# Patient Record
Sex: Male | Born: 1985 | Race: White | Hispanic: No | Smoking: Never smoker
Health system: Southern US, Community
[De-identification: ages and names within clinical notes are randomized; demographics above are authoritative.]

## PROBLEM LIST (undated history)

## (undated) HISTORY — PX: KNEE SURGERY: SHX244

---

## 2010-09-06 ENCOUNTER — Emergency Department (HOSPITAL_COMMUNITY): Admission: EM | Admit: 2010-09-06 | Discharge: 2010-09-06 | Payer: Self-pay | Admitting: Emergency Medicine

## 2011-12-31 ENCOUNTER — Encounter (HOSPITAL_COMMUNITY): Payer: Self-pay | Admitting: *Deleted

## 2011-12-31 ENCOUNTER — Emergency Department (HOSPITAL_COMMUNITY): Payer: Worker's Compensation

## 2011-12-31 ENCOUNTER — Emergency Department (HOSPITAL_COMMUNITY)
Admission: EM | Admit: 2011-12-31 | Discharge: 2011-12-31 | Disposition: A | Discharge status: Home / Self Care | Payer: Worker's Compensation | Attending: Emergency Medicine | Admitting: Emergency Medicine

## 2011-12-31 DIAGNOSIS — S63502A Unspecified sprain of left wrist, initial encounter: Secondary | ICD-10-CM

## 2011-12-31 DIAGNOSIS — IMO0002 Reserved for concepts with insufficient information to code with codable children: Secondary | ICD-10-CM | POA: Insufficient documentation

## 2011-12-31 DIAGNOSIS — M7989 Other specified soft tissue disorders: Secondary | ICD-10-CM | POA: Insufficient documentation

## 2011-12-31 DIAGNOSIS — S63602A Unspecified sprain of left thumb, initial encounter: Secondary | ICD-10-CM

## 2011-12-31 DIAGNOSIS — M25539 Pain in unspecified wrist: Secondary | ICD-10-CM | POA: Insufficient documentation

## 2011-12-31 DIAGNOSIS — M25439 Effusion, unspecified wrist: Secondary | ICD-10-CM | POA: Insufficient documentation

## 2011-12-31 DIAGNOSIS — S63509A Unspecified sprain of unspecified wrist, initial encounter: Secondary | ICD-10-CM | POA: Insufficient documentation

## 2011-12-31 DIAGNOSIS — S6390XA Sprain of unspecified part of unspecified wrist and hand, initial encounter: Secondary | ICD-10-CM | POA: Insufficient documentation

## 2011-12-31 NOTE — ED Notes (Addendum)
Pt alert & oriented x4, stable gait. Pt given discharge instructions, paperwork. Patient instructed to stop at the registration desk to finish any additional paperwork. pt verbalized understanding. Pt left department w/ no further questions.  
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
## 2011-12-31 NOTE — ED Notes (Signed)
Ice applied to left wrist

## 2011-12-31 NOTE — ED Notes (Signed)
Pt has sensation in hand & fingers. Pain when moving thumb. Had pt remove ring as a precaution against swelling

## 2011-12-31 NOTE — ED Notes (Signed)
Patient transported to X-ray 
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
## 2011-12-31 NOTE — Discharge Instructions (Signed)
Sprains Sprains are painful injuries to joints as a result of partial or complete tearing of ligaments. HOME CARE INSTRUCTIONS   For the first 24 hours, keep the injured limb raised on 2 pillows while lying down.   Apply ice bags about every 2 hours for 20 to 30 minutes, while awake, to the injured area for the first 24 hours. Then apply as directed by your caregiver. Place the ice in a plastic bag with a towel around it to prevent frostbite to the skin.   Only take over-the-counter or prescription medicines for pain, discomfort, or fever as directed by your caregiver.   If an ace bandage (a stretchy, elastic wrapping bandage) has been applied today, remove and reapply every 3 to 4 hours. Apply firm enough to keep swelling down. Donot apply tightly. Watch fingers or toes for swelling, bluish discoloration, coldness, numbness, or excessive pain. If any of these problems (symptoms) occur, remove the ace bandage and reapply it more loosely. Contact your caregiver or return to this location if these symptoms persist.  Persistent pain and inability to use the injured area for more than 2 to 3 days are warning signs. See a caregiver for a follow-up visit as soon as possible. A hairline fracture (broken bone) may not show on X-rays. Persistent pain and swelling indicate that further evaluation, use of crutches, and/or more X-rays are needed. X-rays may sometimes not show a small fracture until a week or ten days later. Make a follow-up appointment with your own caregiver or to whom we have referred you. A specialist in reading X-rays(radiologist) will re-read your X-rays. Make sure you know how to obtain your X-ray results. Do not assume everything is normal if you do not hear from us. SEEK IMMEDIATE MEDICAL CARE IF:  You develop severe pain or more swelling.   The pain is not controlled with medicine.   Your skin or nails below the injury turn blue or grey or feel cold or numb.  Document Released:  10/09/2000 Document Revised: 10/01/2011 Document Reviewed: 05/28/2008 [employer] Patient Information 2012 [employer]. 

## 2017-02-04 DIAGNOSIS — Z0000 Encounter for general adult medical examination without abnormal findings: Secondary | ICD-10-CM | POA: Diagnosis not present

## 2017-02-04 DIAGNOSIS — Z6834 Body mass index (BMI) 34.0-34.9, adult: Secondary | ICD-10-CM | POA: Diagnosis not present

## 2018-03-04 DIAGNOSIS — D142 Benign neoplasm of trachea: Secondary | ICD-10-CM | POA: Diagnosis not present

## 2018-03-04 DIAGNOSIS — Z6833 Body mass index (BMI) 33.0-33.9, adult: Secondary | ICD-10-CM | POA: Diagnosis not present

## 2018-03-07 ENCOUNTER — Other Ambulatory Visit (HOSPITAL_COMMUNITY): Payer: Self-pay | Admitting: Internal Medicine

## 2018-03-07 DIAGNOSIS — R221 Localized swelling, mass and lump, neck: Secondary | ICD-10-CM

## 2018-03-09 ENCOUNTER — Ambulatory Visit (HOSPITAL_COMMUNITY)
Admission: RE | Admit: 2018-03-09 | Discharge: 2018-03-09 | Disposition: A | Discharge status: Home / Self Care | Payer: BLUE CROSS/BLUE SHIELD | Source: Ambulatory Visit | Attending: Internal Medicine | Admitting: Internal Medicine

## 2018-03-09 DIAGNOSIS — R221 Localized swelling, mass and lump, neck: Secondary | ICD-10-CM | POA: Insufficient documentation

## 2018-03-11 ENCOUNTER — Other Ambulatory Visit (HOSPITAL_COMMUNITY): Payer: Self-pay | Admitting: Internal Medicine

## 2018-03-11 DIAGNOSIS — R221 Localized swelling, mass and lump, neck: Secondary | ICD-10-CM

## 2018-03-11 DIAGNOSIS — R9389 Abnormal findings on diagnostic imaging of other specified body structures: Secondary | ICD-10-CM

## 2018-03-14 ENCOUNTER — Ambulatory Visit (HOSPITAL_COMMUNITY)
Admission: RE | Admit: 2018-03-14 | Discharge: 2018-03-14 | Disposition: A | Discharge status: Home / Self Care | Payer: BLUE CROSS/BLUE SHIELD | Source: Ambulatory Visit | Attending: Internal Medicine | Admitting: Internal Medicine

## 2018-03-14 DIAGNOSIS — R9389 Abnormal findings on diagnostic imaging of other specified body structures: Secondary | ICD-10-CM | POA: Diagnosis not present

## 2018-03-14 DIAGNOSIS — R221 Localized swelling, mass and lump, neck: Secondary | ICD-10-CM | POA: Diagnosis not present

## 2018-03-14 DIAGNOSIS — E041 Nontoxic single thyroid nodule: Secondary | ICD-10-CM | POA: Diagnosis not present

## 2018-03-14 MED ORDER — IOPAMIDOL (ISOVUE-300) INJECTION 61%
100.0000 mL | Freq: Once | INTRAVENOUS | Status: AC | PRN
  Administered 2018-03-14: 100 mL via INTRAVENOUS

## 2018-03-16 ENCOUNTER — Other Ambulatory Visit (HOSPITAL_COMMUNITY): Payer: Self-pay | Admitting: Adult Health Nurse Practitioner

## 2018-03-16 DIAGNOSIS — E041 Nontoxic single thyroid nodule: Secondary | ICD-10-CM

## 2018-03-18 ENCOUNTER — Ambulatory Visit (HOSPITAL_COMMUNITY)
Admission: RE | Admit: 2018-03-18 | Discharge: 2018-03-18 | Disposition: A | Discharge status: Home / Self Care | Payer: BLUE CROSS/BLUE SHIELD | Source: Ambulatory Visit | Attending: Adult Health Nurse Practitioner | Admitting: Adult Health Nurse Practitioner

## 2018-03-18 DIAGNOSIS — E041 Nontoxic single thyroid nodule: Secondary | ICD-10-CM | POA: Diagnosis not present

## 2018-03-28 DIAGNOSIS — E782 Mixed hyperlipidemia: Secondary | ICD-10-CM | POA: Diagnosis not present

## 2018-04-01 DIAGNOSIS — Z6833 Body mass index (BMI) 33.0-33.9, adult: Secondary | ICD-10-CM | POA: Diagnosis not present

## 2018-04-01 DIAGNOSIS — D142 Benign neoplasm of trachea: Secondary | ICD-10-CM | POA: Diagnosis not present

## 2018-05-26 DIAGNOSIS — M25551 Pain in right hip: Secondary | ICD-10-CM | POA: Diagnosis not present

## 2018-05-26 DIAGNOSIS — R5383 Other fatigue: Secondary | ICD-10-CM | POA: Diagnosis not present

## 2018-05-26 DIAGNOSIS — Z13220 Encounter for screening for lipoid disorders: Secondary | ICD-10-CM | POA: Diagnosis not present

## 2018-05-26 DIAGNOSIS — Z131 Encounter for screening for diabetes mellitus: Secondary | ICD-10-CM | POA: Diagnosis not present

## 2018-05-26 DIAGNOSIS — E669 Obesity, unspecified: Secondary | ICD-10-CM | POA: Diagnosis not present

## 2018-05-26 DIAGNOSIS — E559 Vitamin D deficiency, unspecified: Secondary | ICD-10-CM | POA: Diagnosis not present

## 2018-06-06 DIAGNOSIS — S233XXA Sprain of ligaments of thoracic spine, initial encounter: Secondary | ICD-10-CM | POA: Diagnosis not present

## 2018-06-06 DIAGNOSIS — S134XXA Sprain of ligaments of cervical spine, initial encounter: Secondary | ICD-10-CM | POA: Diagnosis not present

## 2018-06-06 DIAGNOSIS — S338XXA Sprain of other parts of lumbar spine and pelvis, initial encounter: Secondary | ICD-10-CM | POA: Diagnosis not present

## 2018-06-07 DIAGNOSIS — S338XXA Sprain of other parts of lumbar spine and pelvis, initial encounter: Secondary | ICD-10-CM | POA: Diagnosis not present

## 2018-06-07 DIAGNOSIS — S233XXA Sprain of ligaments of thoracic spine, initial encounter: Secondary | ICD-10-CM | POA: Diagnosis not present

## 2018-06-07 DIAGNOSIS — S134XXA Sprain of ligaments of cervical spine, initial encounter: Secondary | ICD-10-CM | POA: Diagnosis not present

## 2018-06-13 DIAGNOSIS — S134XXA Sprain of ligaments of cervical spine, initial encounter: Secondary | ICD-10-CM | POA: Diagnosis not present

## 2018-06-13 DIAGNOSIS — S233XXA Sprain of ligaments of thoracic spine, initial encounter: Secondary | ICD-10-CM | POA: Diagnosis not present

## 2018-06-13 DIAGNOSIS — S338XXA Sprain of other parts of lumbar spine and pelvis, initial encounter: Secondary | ICD-10-CM | POA: Diagnosis not present

## 2018-06-21 DIAGNOSIS — S134XXA Sprain of ligaments of cervical spine, initial encounter: Secondary | ICD-10-CM | POA: Diagnosis not present

## 2018-06-21 DIAGNOSIS — S338XXA Sprain of other parts of lumbar spine and pelvis, initial encounter: Secondary | ICD-10-CM | POA: Diagnosis not present

## 2018-06-21 DIAGNOSIS — S233XXA Sprain of ligaments of thoracic spine, initial encounter: Secondary | ICD-10-CM | POA: Diagnosis not present

## 2018-06-29 DIAGNOSIS — S338XXA Sprain of other parts of lumbar spine and pelvis, initial encounter: Secondary | ICD-10-CM | POA: Diagnosis not present

## 2018-06-29 DIAGNOSIS — S134XXA Sprain of ligaments of cervical spine, initial encounter: Secondary | ICD-10-CM | POA: Diagnosis not present

## 2018-06-29 DIAGNOSIS — S233XXA Sprain of ligaments of thoracic spine, initial encounter: Secondary | ICD-10-CM | POA: Diagnosis not present

## 2018-07-27 DIAGNOSIS — E669 Obesity, unspecified: Secondary | ICD-10-CM | POA: Diagnosis not present

## 2018-07-27 DIAGNOSIS — M25551 Pain in right hip: Secondary | ICD-10-CM | POA: Diagnosis not present

## 2018-07-27 DIAGNOSIS — E559 Vitamin D deficiency, unspecified: Secondary | ICD-10-CM | POA: Diagnosis not present

## 2018-10-24 DIAGNOSIS — R7302 Impaired glucose tolerance (oral): Secondary | ICD-10-CM | POA: Diagnosis not present

## 2018-10-24 DIAGNOSIS — E669 Obesity, unspecified: Secondary | ICD-10-CM | POA: Diagnosis not present

## 2018-10-24 DIAGNOSIS — E559 Vitamin D deficiency, unspecified: Secondary | ICD-10-CM | POA: Diagnosis not present

## 2018-12-29 ENCOUNTER — Encounter (INDEPENDENT_AMBULATORY_CARE_PROVIDER_SITE_OTHER): Payer: Self-pay | Admitting: Internal Medicine

## 2019-01-19 ENCOUNTER — Encounter (INDEPENDENT_AMBULATORY_CARE_PROVIDER_SITE_OTHER): Payer: Self-pay | Admitting: Internal Medicine

## 2019-01-26 ENCOUNTER — Ambulatory Visit (INDEPENDENT_AMBULATORY_CARE_PROVIDER_SITE_OTHER): Payer: Self-pay | Admitting: Internal Medicine

## 2019-01-26 DIAGNOSIS — E669 Obesity, unspecified: Secondary | ICD-10-CM | POA: Diagnosis not present

## 2019-01-26 DIAGNOSIS — R7302 Impaired glucose tolerance (oral): Secondary | ICD-10-CM | POA: Diagnosis not present

## 2019-01-26 DIAGNOSIS — E559 Vitamin D deficiency, unspecified: Secondary | ICD-10-CM | POA: Diagnosis not present

## 2020-04-07 IMAGING — US US THYROID
1 series · 13 of 25 positions shown · non-contrast
Comparison: None.

CLINICAL DATA: Incidental on CT. Right thyroid nodule noted on
recent CT.

EXAM:
THYROID ULTRASOUND
TECHNIQUE: Ultrasound examination of the thyroid gland and adjacent soft
tissues was performed.

[Series 1: us thyroid · 0.06mm/px · 13 of 63 slices shown]
[im 1/63]
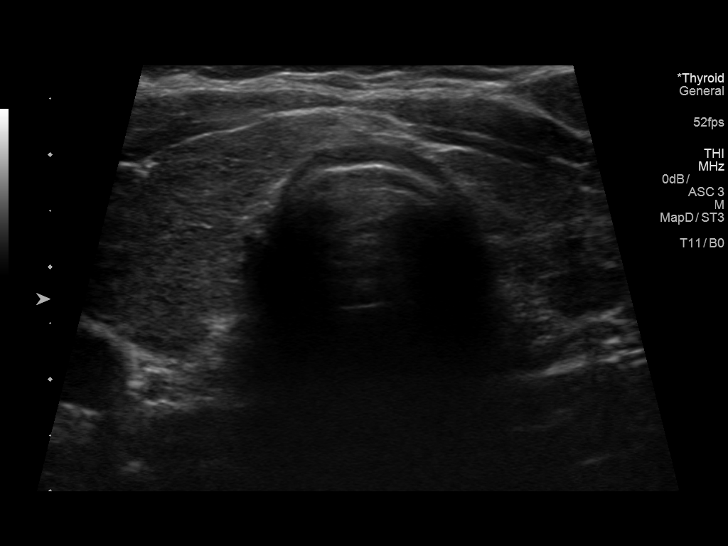
[im 6/63]
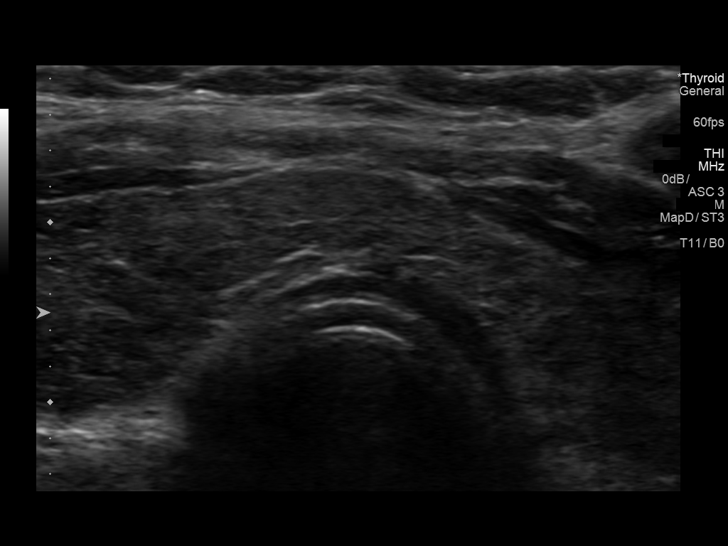
[im 11/63]
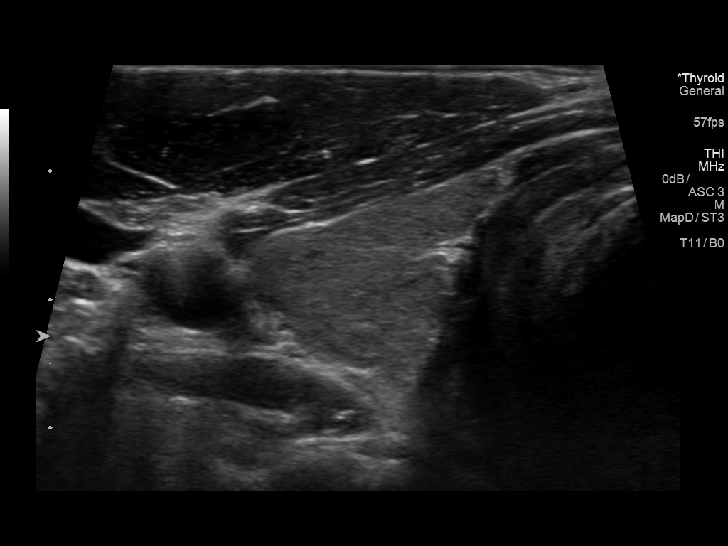
[im 16/63]
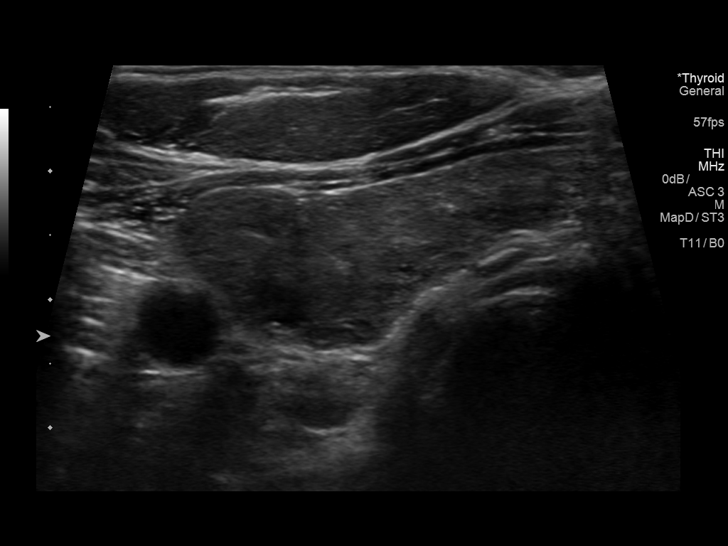
[im 21/63]
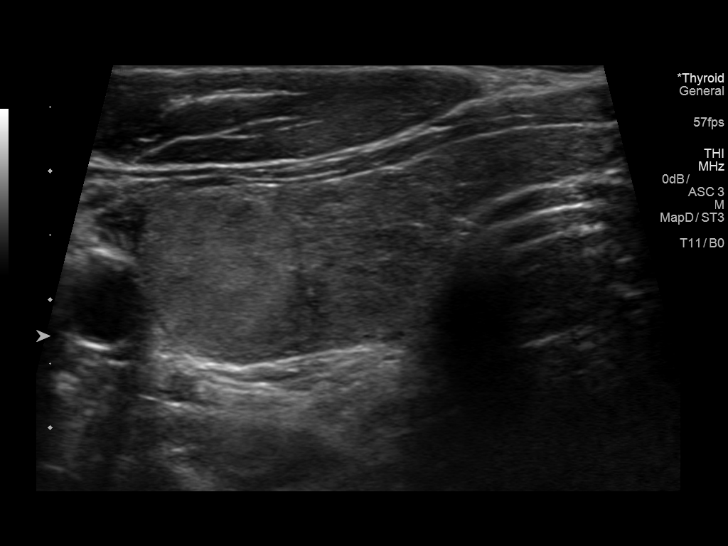
[im 26/63]
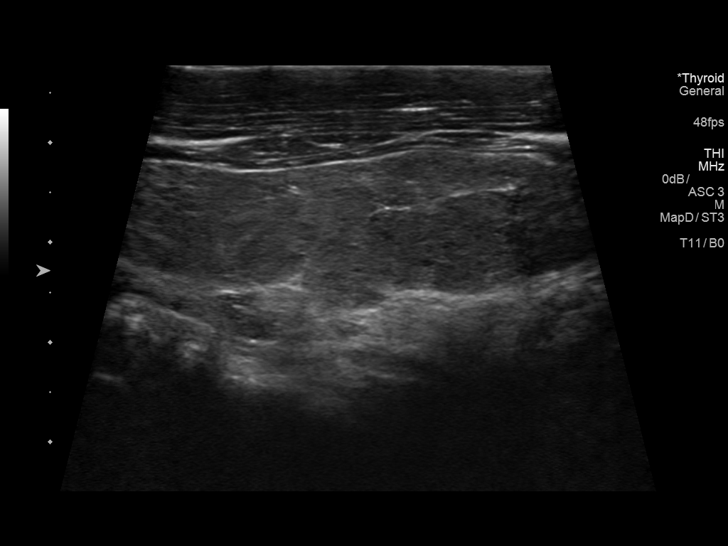
[im 32/63]
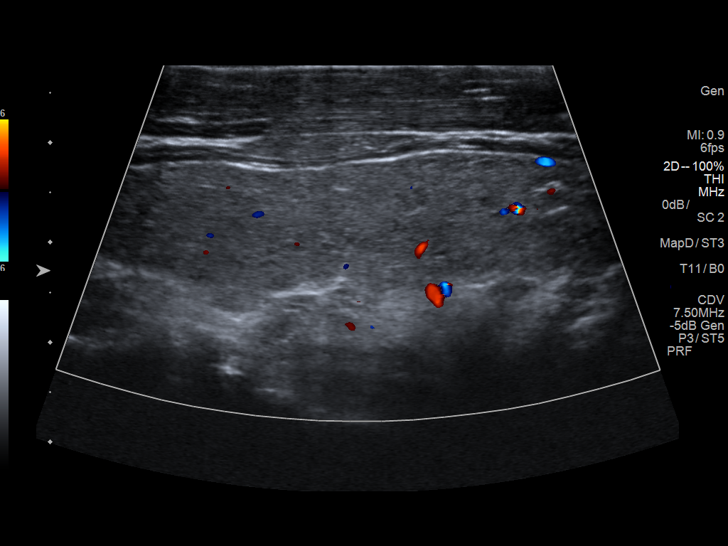
[im 37/63]
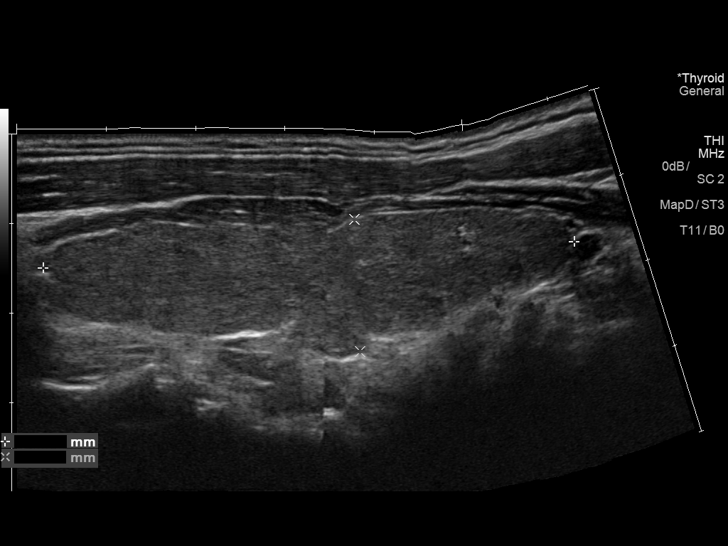
[im 42/63]
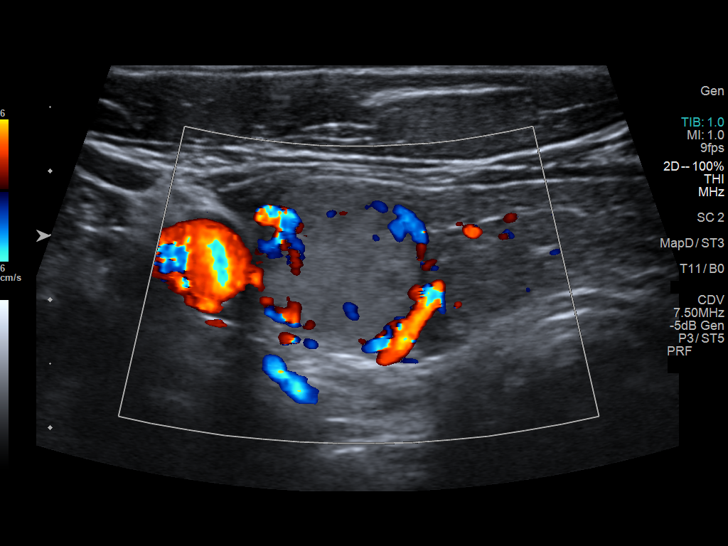
[im 47/63]
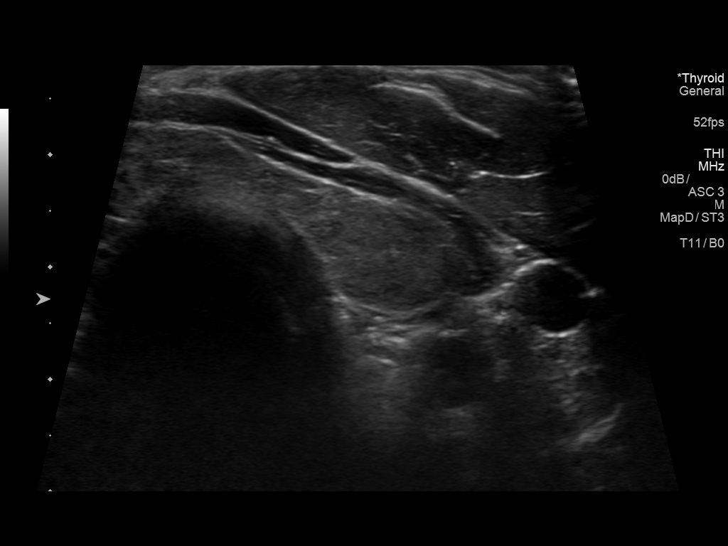
[im 52/63]
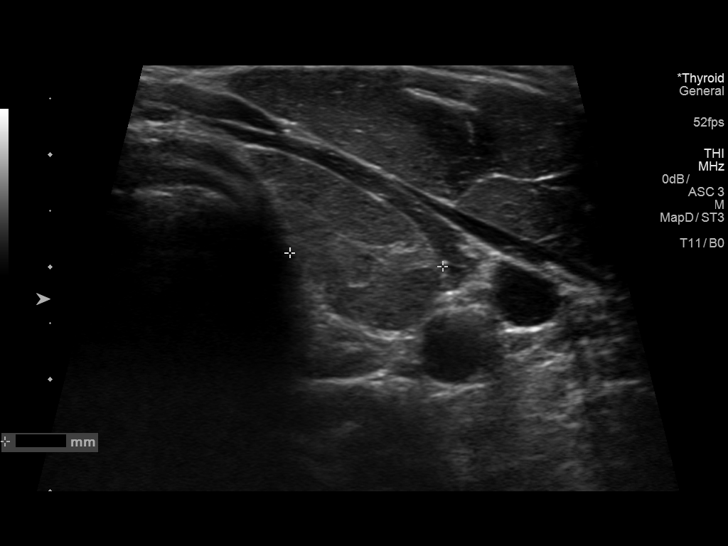
[im 57/63]
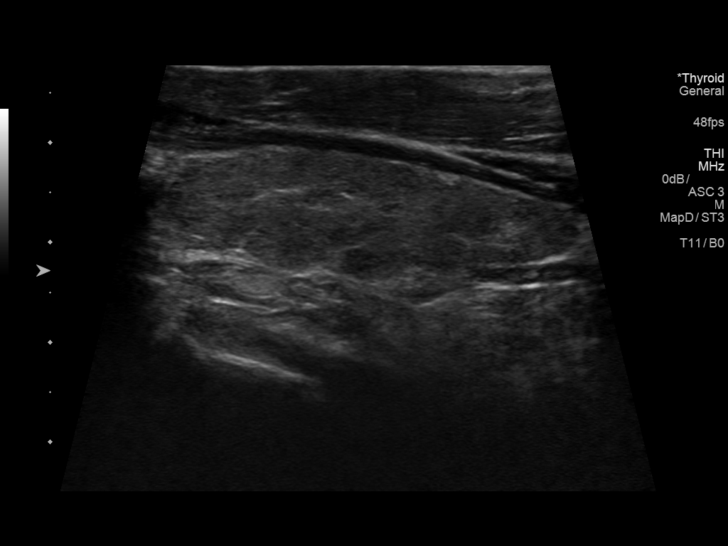
[im 63/63]
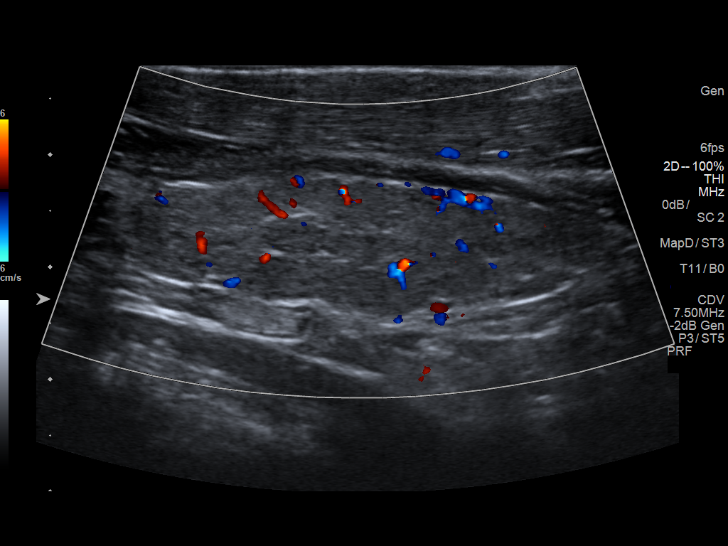

[13 of 25 positions shown; findings below may reference images not displayed]

FINDINGS: Parenchymal Echotexture: Moderately heterogenous

Isthmus: 0.5 cm

Right lobe: 5.9 x 2.3 x 1.5 cm

Left lobe: 4.6 x 1.4 x 1.2 cm

_________________________________________________________

Estimated total number of nodules >/= 1 cm: 1

Number of spongiform nodules >/=  2 cm not described below (TR1): 0

Number of mixed cystic and solid nodules >/= 1.5 cm not described
below (TR2): 0

_________________________________________________________

Nodule # 1:

Location: Right; Mid

Maximum size: 1.5 cm; Other 2 dimensions: 1.3 x 1.2 cm

Composition: solid/almost completely solid (2)

Echogenicity: hyperechoic (1)

Shape: not taller-than-wide (0)

Margins: smooth (0)

Echogenic foci: none (0)

ACR TI-RADS total points: 3.

ACR TI-RADS risk category: TR3 (3 points).

ACR TI-RADS recommendations:

*Given size (>/= 1.5 - 2.4 cm) and appearance, a follow-up
ultrasound in 1 year should be considered based on TI-RADS criteria.

_________________________________________________________
IMPRESSION: Right nodule 1 meets criteria for annual follow-up.

The above is in keeping with the ACR TI-RADS recommendations - [HOSPITAL] 0902;[DATE].

## 2021-07-13 ENCOUNTER — Other Ambulatory Visit: Payer: Self-pay

## 2021-07-13 ENCOUNTER — Emergency Department (HOSPITAL_COMMUNITY): Payer: Commercial Managed Care - PPO

## 2021-07-13 ENCOUNTER — Observation Stay (HOSPITAL_COMMUNITY)
Admission: EM | Admit: 2021-07-13 | Discharge: 2021-07-14 | Disposition: A | Discharge status: Home / Self Care | Payer: Commercial Managed Care - PPO | Attending: Internal Medicine | Admitting: Internal Medicine

## 2021-07-13 ENCOUNTER — Encounter (HOSPITAL_COMMUNITY): Payer: Self-pay | Admitting: Emergency Medicine

## 2021-07-13 DIAGNOSIS — W19XXXA Unspecified fall, initial encounter: Secondary | ICD-10-CM | POA: Diagnosis present

## 2021-07-13 DIAGNOSIS — S3992XA Unspecified injury of lower back, initial encounter: Secondary | ICD-10-CM | POA: Diagnosis present

## 2021-07-13 DIAGNOSIS — T1490XA Injury, unspecified, initial encounter: Secondary | ICD-10-CM

## 2021-07-13 DIAGNOSIS — R42 Dizziness and giddiness: Secondary | ICD-10-CM | POA: Diagnosis not present

## 2021-07-13 DIAGNOSIS — Z20822 Contact with and (suspected) exposure to covid-19: Secondary | ICD-10-CM | POA: Diagnosis not present

## 2021-07-13 DIAGNOSIS — M545 Low back pain, unspecified: Secondary | ICD-10-CM

## 2021-07-13 LAB — COMPREHENSIVE METABOLIC PANEL
ALT: 59 U/L — ABNORMAL HIGH (ref 0–44)
AST: 119 U/L — ABNORMAL HIGH (ref 15–41)
Albumin: 3.9 g/dL (ref 3.5–5.0)
Alkaline Phosphatase: 60 U/L (ref 38–126)
Anion gap: 5 (ref 5–15)
BUN: 20 mg/dL (ref 6–20)
CO2: 25 mmol/L (ref 22–32)
Calcium: 8.4 mg/dL — ABNORMAL LOW (ref 8.9–10.3)
Chloride: 106 mmol/L (ref 98–111)
Creatinine, Ser: 1.23 mg/dL (ref 0.61–1.24)
GFR, Estimated: >60 mL/min (ref >60)
Glucose, Bld: 97 mg/dL (ref 70–99)
Potassium: 4 mmol/L (ref 3.5–5.1)
Sodium: 136 mmol/L (ref 135–145)
Total Bilirubin: 0.4 mg/dL (ref 0.3–1.2)
Total Protein: 6.7 g/dL (ref 6.5–8.1)

## 2021-07-13 LAB — CBC WITH DIFFERENTIAL/PLATELET
Abs Immature Granulocytes: 0.03 10*3/uL (ref 0.00–0.07)
Basophils Absolute: 0.1 10*3/uL (ref 0.0–0.1)
Basophils Relative: 1 %
Eosinophils Absolute: 0.2 10*3/uL (ref 0.0–0.5)
Eosinophils Relative: 2 %
HCT: 41.6 % (ref 39.0–52.0)
Hemoglobin: 13.4 g/dL (ref 13.0–17.0)
Immature Granulocytes: 0 %
Lymphocytes Relative: 23 %
Lymphs Abs: 2.1 10*3/uL (ref 0.7–4.0)
MCH: 27.2 pg (ref 26.0–34.0)
MCHC: 32.2 g/dL (ref 30.0–36.0)
MCV: 84.4 fL (ref 80.0–100.0)
Monocytes Absolute: 0.7 10*3/uL (ref 0.1–1.0)
Monocytes Relative: 8 %
Neutro Abs: 6.1 10*3/uL (ref 1.7–7.7)
Neutrophils Relative %: 66 %
Platelets: 247 10*3/uL (ref 150–400)
RBC: 4.93 MIL/uL (ref 4.22–5.81)
RDW: 13.5 % (ref 11.5–15.5)
WBC: 9.2 10*3/uL (ref 4.0–10.5)
nRBC: 0 % (ref 0.0–0.2)

## 2021-07-13 LAB — PROTIME-INR
INR: 1.1 (ref 0.8–1.2)
Prothrombin Time: 13.8 seconds (ref 11.4–15.2)

## 2021-07-13 MED ORDER — SODIUM CHLORIDE 0.9 % IV BOLUS
500.0000 mL | Freq: Once | INTRAVENOUS | Status: AC
  Administered 2021-07-13: 500 mL via INTRAVENOUS

## 2021-07-13 MED ORDER — DEXAMETHASONE SODIUM PHOSPHATE 10 MG/ML IJ SOLN
10.0000 mg | Freq: Once | INTRAMUSCULAR | Status: AC
  Administered 2021-07-14: 10 mg via INTRAVENOUS
  Filled 2021-07-13: qty 1

## 2021-07-13 MED ORDER — IOHEXOL 350 MG/ML SOLN
80.0000 mL | Freq: Once | INTRAVENOUS | Status: AC | PRN
  Administered 2021-07-13: 80 mL via INTRAVENOUS

## 2021-07-13 MED ORDER — HYDROMORPHONE HCL 1 MG/ML IJ SOLN
1.0000 mg | Freq: Once | INTRAMUSCULAR | Status: AC
  Administered 2021-07-13: 1 mg via INTRAVENOUS
  Filled 2021-07-13: qty 1

## 2021-07-13 MED ORDER — MORPHINE SULFATE (PF) 4 MG/ML IV SOLN
4.0000 mg | Freq: Once | INTRAVENOUS | Status: AC
  Administered 2021-07-13: 4 mg via INTRAVENOUS
  Filled 2021-07-13: qty 1
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
## 2021-07-13 NOTE — ED Provider Notes (Signed)
[facility] EMERGENCY DEPARTMENT Provider Note   CSN: [id number] Arrival date & time: 07/13/21  1956     History Chief Complaint  Patient presents with   Trauma    Thrown from horse    Cody Yu is a 36 y.o. male.  HPI  35 year old male with no admitted past medical history presents emergency department after being thrown from a horse.  Patient reportedly landed flat on his back, denied LOC.  Was not wearing a helmet.  Currently complaining of severe lower back pain.  Does not take any anticoagulation.  Currently denying any chest pain, difficulty breathing, abdominal pain.  Denies any numbness/acute weakness in the lower extremities.  History reviewed. No pertinent past medical history.  There are no problems to display for this patient.   Past Surgical History:  Procedure Laterality Date   KNEE SURGERY     2 on each knee       History reviewed. No pertinent family history.  Social History   Tobacco Use   Smoking status: Never  Substance Use Topics   Alcohol use: No   Drug use: No    Home Medications Prior to Admission medications   Medication Sig Start Date End Date Taking? Authorizing Provider  Cholecalciferol (VITAMIN D3) 1.25 MG (50000 UT) CAPS Take 1 capsule by mouth.    Wilson Singer, MD    Allergies    Patient has no active allergies.  Review of Systems   Review of Systems  HENT:  Negative for trouble swallowing and voice change.   Eyes:  Negative for visual disturbance.  Respiratory:  Negative for shortness of breath.   Cardiovascular:  Negative for chest pain.  Gastrointestinal:  Negative for abdominal pain.  Genitourinary:  Negative for difficulty urinating.  Musculoskeletal:  Positive for back pain. Negative for neck pain.  Neurological:  Negative for weakness, numbness and headaches.  Psychiatric/Behavioral:  Negative for confusion.    Physical Exam Updated Vital Signs BP (!) 131/91 (BP Location: Right Arm)   Pulse (!) 53    Temp 98.1 F (36.7 C) (Oral)   Resp 10   Ht 6\' 1"  (1.854 m)   Wt 108.9 kg   SpO2 100%   BMI 31.66 kg/m   Physical Exam Vitals and nursing note reviewed.  Constitutional:      General: He is not in acute distress. HENT:     Head: Normocephalic.     Comments: Midface is stable    Right Ear: External ear normal.     Left Ear: External ear normal.     Nose: Nose normal.     Comments: No septal hematoma Eyes:     Conjunctiva/sclera: Conjunctivae normal.     Pupils: Pupils are equal, round, and reactive to light.  Neck:     Comments: Cervical collar in place Cardiovascular:     Rate and Rhythm: Normal rate.  Pulmonary:     Effort: Pulmonary effort is normal.  Abdominal:     General: Abdomen is flat.     Palpations: Abdomen is soft.     Tenderness: There is no abdominal tenderness. There is no guarding.     Comments: No bruising  Musculoskeletal:        General: No deformity.     Cervical back: No tenderness.     Comments: Pelvis is stable, tenderness to palpation of the lower lumbar spine on logroll, edema overlying the lower spine without any palpable deformities, equal strength and sensation/pulses in the lower  extremities  Skin:    General: Skin is warm.  Neurological:     Mental Status: He is alert and oriented to person, place, and time.    ED Results / Procedures / Treatments   Labs (all labs ordered are listed, but only abnormal results are displayed) Labs Reviewed  CBC WITH DIFFERENTIAL/PLATELET  COMPREHENSIVE METABOLIC PANEL  PROTIME-INR    EKG None  Radiology No results found.  Procedures Procedures   Medications Ordered in ED Medications  morphine 4 MG/ML injection 4 mg (has no administration in time range)  sodium chloride 0.9 % bolus 500 mL (has no administration in time range)    ED Course  I have reviewed the triage vital signs and the nursing notes.  Pertinent labs & imaging results that were available during my care of the patient were  reviewed by me and considered in my medical decision making (see chart for details).    MDM Rules/Calculators/A&P                            35 year old male presents the emergency department for evaluation of trauma.  He was thrown off a horse and landed flat on his back, complaining of severe lower back pain.  He has midline spinal tenderness to palpation with overlying swelling.  Weak in the right lower extremity most likely secondary to pain.  Vitals are stable, he is not anticoagulated.  CT of the head, cervical spine, chest abdomen pelvis shows no acute traumatic injury.  Patient continues to have severe lower back pain with some slight weakness in the right foot as well as complaint of tingling sensation in the right toes.  Spoke with on-call neurosurgery who recommends admission for pain control, 10 mg of IV Decadron and reevaluation.  If still symptomatic in the morning can plan for MRI at this facility.  No indication for emergent ER to ER transfer at this time.  Patients evaluation and results requires admission for further treatment and care. Patient agrees with admission plan, offers no new complaints and is stable/unchanged at time of admit.  Final Clinical Impression(s) / ED Diagnoses Final diagnoses:  Trauma    Rx / DC Orders ED Discharge Orders     None        Rozelle Logan, DO 07/13/21 2351

## 2021-07-14 ENCOUNTER — Encounter (HOSPITAL_COMMUNITY): Payer: Self-pay | Admitting: Family Medicine

## 2021-07-14 ENCOUNTER — Observation Stay (HOSPITAL_COMMUNITY): Payer: Commercial Managed Care - PPO

## 2021-07-14 DIAGNOSIS — W19XXXA Unspecified fall, initial encounter: Secondary | ICD-10-CM | POA: Diagnosis not present

## 2021-07-14 DIAGNOSIS — W19XXXD Unspecified fall, subsequent encounter: Secondary | ICD-10-CM | POA: Diagnosis not present

## 2021-07-14 DIAGNOSIS — M5126 Other intervertebral disc displacement, lumbar region: Secondary | ICD-10-CM

## 2021-07-14 LAB — CBC WITH DIFFERENTIAL/PLATELET
Abs Immature Granulocytes: 0.05 10*3/uL (ref 0.00–0.07)
Basophils Absolute: 0 10*3/uL (ref 0.0–0.1)
Basophils Relative: 0 %
Eosinophils Absolute: 0 10*3/uL (ref 0.0–0.5)
Eosinophils Relative: 0 %
HCT: 47.2 % (ref 39.0–52.0)
Hemoglobin: 15 g/dL (ref 13.0–17.0)
Immature Granulocytes: 1 %
Lymphocytes Relative: 9 %
Lymphs Abs: 1 10*3/uL (ref 0.7–4.0)
MCH: 27.1 pg (ref 26.0–34.0)
MCHC: 31.8 g/dL (ref 30.0–36.0)
MCV: 85.4 fL (ref 80.0–100.0)
Monocytes Absolute: 0.2 10*3/uL (ref 0.1–1.0)
Monocytes Relative: 2 %
Neutro Abs: 9.6 10*3/uL — ABNORMAL HIGH (ref 1.7–7.7)
Neutrophils Relative %: 88 %
Platelets: 257 10*3/uL (ref 150–400)
RBC: 5.53 MIL/uL (ref 4.22–5.81)
RDW: 13.7 % (ref 11.5–15.5)
WBC: 10.9 10*3/uL — ABNORMAL HIGH (ref 4.0–10.5)
nRBC: 0 % (ref 0.0–0.2)

## 2021-07-14 LAB — COMPREHENSIVE METABOLIC PANEL
ALT: 61 U/L — ABNORMAL HIGH (ref 0–44)
AST: 107 U/L — ABNORMAL HIGH (ref 15–41)
Albumin: 4.3 g/dL (ref 3.5–5.0)
Alkaline Phosphatase: 72 U/L (ref 38–126)
Anion gap: 7 (ref 5–15)
BUN: 18 mg/dL (ref 6–20)
CO2: 28 mmol/L (ref 22–32)
Calcium: 9.2 mg/dL (ref 8.9–10.3)
Chloride: 103 mmol/L (ref 98–111)
Creatinine, Ser: 1.11 mg/dL (ref 0.61–1.24)
GFR, Estimated: >60 mL/min (ref >60)
Glucose, Bld: 143 mg/dL — ABNORMAL HIGH (ref 70–99)
Potassium: 4.5 mmol/L (ref 3.5–5.1)
Sodium: 138 mmol/L (ref 135–145)
Total Bilirubin: 0.7 mg/dL (ref 0.3–1.2)
Total Protein: 7.5 g/dL (ref 6.5–8.1)

## 2021-07-14 LAB — HIV ANTIBODY (ROUTINE TESTING W REFLEX): HIV Screen 4th Generation wRfx: NONREACTIVE

## 2021-07-14 LAB — RESP PANEL BY RT-PCR (FLU A&B, COVID) ARPGX2
Influenza A by PCR: NEGATIVE
Influenza B by PCR: NEGATIVE
SARS Coronavirus 2 by RT PCR: NEGATIVE

## 2021-07-14 LAB — MAGNESIUM: Magnesium: 2 mg/dL (ref 1.7–2.4)

## 2021-07-14 MED ORDER — MORPHINE SULFATE (PF) 4 MG/ML IV SOLN
4.0000 mg | INTRAVENOUS | Status: DC | PRN
  Administered 2021-07-14 (×2): 4 mg via INTRAVENOUS
  Filled 2021-07-14 (×2): qty 1

## 2021-07-14 MED ORDER — DEXAMETHASONE 4 MG PO TABS: 4.0000 mg | ORAL_TABLET | Freq: Two times a day (BID) | ORAL | 0 refills | Status: AC

## 2021-07-14 MED ORDER — ACETAMINOPHEN 650 MG RE SUPP: 650.0000 mg | Freq: Four times a day (QID) | RECTAL | Status: DC | PRN

## 2021-07-14 MED ORDER — HYDRALAZINE HCL 20 MG/ML IJ SOLN: 10.0000 mg | INTRAMUSCULAR | Status: DC | PRN

## 2021-07-14 MED ORDER — ACETAMINOPHEN 325 MG PO TABS: 650.0000 mg | ORAL_TABLET | Freq: Four times a day (QID) | ORAL | Status: DC | PRN

## 2021-07-14 MED ORDER — CYCLOBENZAPRINE HCL 5 MG PO TABS: 5.0000 mg | ORAL_TABLET | Freq: Three times a day (TID) | ORAL | 0 refills | Status: DC | PRN

## 2021-07-14 MED ORDER — ONDANSETRON HCL 4 MG/2ML IJ SOLN: 4.0000 mg | Freq: Four times a day (QID) | INTRAMUSCULAR | Status: DC | PRN

## 2021-07-14 MED ORDER — OXYCODONE HCL 5 MG PO TABS: 5.0000 mg | ORAL_TABLET | ORAL | Status: DC | PRN

## 2021-07-14 MED ORDER — IBUPROFEN 400 MG PO TABS
400.0000 mg | ORAL_TABLET | Freq: Four times a day (QID) | ORAL | Status: DC | PRN
  Administered 2021-07-14: 400 mg via ORAL
  Filled 2021-07-14: qty 1

## 2021-07-14 MED ORDER — ONDANSETRON HCL 4 MG PO TABS: 4.0000 mg | ORAL_TABLET | Freq: Four times a day (QID) | ORAL | Status: DC | PRN

## 2021-07-14 MED ORDER — DIPHENHYDRAMINE HCL 50 MG/ML IJ SOLN
25.0000 mg | Freq: Once | INTRAMUSCULAR | Status: AC
  Administered 2021-07-14: 25 mg via INTRAVENOUS
  Filled 2021-07-14: qty 1

## 2021-07-14 MED ORDER — CYCLOBENZAPRINE HCL 10 MG PO TABS: 5.0000 mg | ORAL_TABLET | Freq: Three times a day (TID) | ORAL | Status: DC | PRN

## 2021-07-14 MED ORDER — IPRATROPIUM-ALBUTEROL 0.5-2.5 (3) MG/3ML IN SOLN: 3.0000 mL | RESPIRATORY_TRACT | Status: DC | PRN

## 2021-07-14 MED ORDER — DEXAMETHASONE 4 MG PO TABS
4.0000 mg | ORAL_TABLET | Freq: Two times a day (BID) | ORAL | Status: DC
  Administered 2021-07-14: 4 mg via ORAL
  Filled 2021-07-14: qty 1

## 2021-07-14 MED ORDER — ENOXAPARIN SODIUM 40 MG/0.4ML IJ SOSY
40.0000 mg | PREFILLED_SYRINGE | INTRAMUSCULAR | Status: DC
  Administered 2021-07-14: 40 mg via SUBCUTANEOUS
  Filled 2021-07-14: qty 0.4

## 2021-07-14 MED ORDER — TRAZODONE HCL 50 MG PO TABS
50.0000 mg | ORAL_TABLET | Freq: Every evening | ORAL | Status: DC | PRN
Start: 2021-07-14 — End: 2021-07-14
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
## 2021-07-14 NOTE — Progress Notes (Signed)
OT Screen Note  Patient Details Name: MAXWEL MEADOWCROFT MRN: [id number] DOB: 1986/06/27   Cancelled Treatment:    Reason Eval/Treat Not Completed: OT screened, no needs identified, will sign off. Spoke with PT after completion of evaluation. Patient is presenting at baseline of independent level. Independent with all ADL task with full and equal strength in BUE. No issues related to OT identified. Thank you for the referral.    Limmie Patricia, OTR/L,CBIS  [phone]  07/14/2021, 3:17 PM

## 2021-07-14 NOTE — Evaluation (Addendum)
Physical Therapy Evaluation Only Patient Details Name: Cody Yu MRN: [id number] DOB: 01-Jan-1986 Today's Date: 07/14/2021  History of Present Illness  Cody Yu  is a 35 y.o. male who presents after thrown from horse, has sharp shooting pain down R leg. CT C-spine, abdomen, pelvis shows no acute displaced fracture or traumatic listhesis of the C-spine. MRI without acute severe pathology lumbar spine (L5-S1 degenerative disc disease with central disc protrusion and annular fissure, mild bil foraminal stenosis without significant canal stenosis. L4-L5  central disc protrusion with mild bil subarticular recess stenosis, no significant canal or foraminal stenosis.) No known PMH.   Clinical Impression  Pt independent at baseline, presents independent with mobility at eval. Normal sensation, coordination, and 5/5 strength throughout BLE. Unable to elicit pain with trunk AROM and AROM WNL. Pt assumes rombger and tandem stance and able to maintain for >30 sec without trunk sway. Pt denies bowel/bladder loss of control, denies saddle paraesthesias, denies LE weakness since landing on back off of horse. MRI without contraindications for PT eval. Educated pt on gentle stretching to alleviate muscle tightness with good understanding, pt well educated regarding PT and exercise. No PT needs identified, pt with spouse at bedside in agreement and hopeful to d/c home quickly.     Recommendations for follow up therapy are one component of a multi-disciplinary discharge planning process, led by the attending physician.  Recommendations may be updated based on patient status, additional functional criteria and insurance authorization.  Follow Up Recommendations No PT follow up    Equipment Recommendations  None recommended by PT    Recommendations for Other Services       Precautions / Restrictions Precautions Precautions: None Restrictions Weight Bearing Restrictions: No      Mobility  Bed  Mobility Overal bed mobility: Independent   Transfers Overall transfer level: Independent     Ambulation/Gait Ambulation/Gait assistance: Independent   Assistive device: None Gait Pattern/deviations: WFL(Within Functional Limits)  General Gait Details: pt ambulating around room, equal bil step length, good stop and turn without LOB, no pain  Stairs            Wheelchair Mobility    Modified Rankin (Stroke Patients Only)       Balance Overall balance assessment: Independent (assumes and maintains romberg stance and tandem stance for >30 sec without trunk sway)        Pertinent Vitals/Pain Pain Assessment: No/denies pain (unable to elicit pain with movements)    Home Living                        Prior Function                 Hand Dominance        Extremity/Trunk Assessment   Upper Extremity Assessment Upper Extremity Assessment: Overall WFL for tasks assessed    Lower Extremity Assessment Lower Extremity Assessment: Overall WFL for tasks assessed (AROM WNL in BLE and trunk, strength 5/5 bil throughout, normal sensation and normal coordination throughout)    Cervical / Trunk Assessment Cervical / Trunk Assessment: Normal  Communication      Cognition Arousal/Alertness: Awake/alert Behavior During Therapy: WFL for tasks assessed/performed Overall Cognitive Status: Within Functional Limits for tasks assessed       General Comments      Exercises     Assessment/Plan    PT Assessment Patent does not need any further PT services  PT Problem List  PT Treatment Interventions      PT Goals (Current goals can be found in the Care Plan section)  Acute Rehab PT Goals Patient Stated Goal: d/c today PT Goal Formulation: All assessment and education complete, DC therapy    Frequency     Barriers to discharge        Co-evaluation               AM-PAC PT "6 Clicks" Mobility  Outcome Measure Help needed turning  from your back to your side while in a flat bed without using bedrails?: None Help needed moving from lying on your back to sitting on the side of a flat bed without using bedrails?: None Help needed moving to and from a bed to a chair (including a wheelchair)?: None Help needed standing up from a chair using your arms (e.g., wheelchair or bedside chair)?: None Help needed to walk in hospital room?: None Help needed climbing 3-5 steps with a railing? : None 6 Click Score: 24    End of Session   Activity Tolerance: Patient tolerated treatment well Patient left: in bed;with call bell/phone within reach;with nursing/sitter in room Nurse Communication: Mobility status PT Visit Diagnosis: Other abnormalities of gait and mobility (R26.89)    Time: 1583-0940 PT Time Calculation (min) (ACUTE ONLY): 8 min   Charges:   PT Evaluation $PT Eval Low Complexity: 1 Low           Tori Kalob Bergen PT, DPT 07/14/21, 2:42 PM

## 2021-07-14 NOTE — Progress Notes (Signed)
OT Cancellation Note  Patient Details Name: Cody Yu MRN: [id number] DOB: 11/20/1985   Cancelled Treatment:    Reason Eval/Treat Not Completed: Medical issues which prohibited therapy;Other (comment) OT orders received and patient's chart reviewed.  Due to right shooting leg pain, OT evaluation will be held until MRI is completed and results are available to rule out any contraindicating medical issues. Will complete OT evaluation at a later time.    Limmie Patricia, OTR/L,CBIS  [phone]  07/14/2021, 9:06 AM

## 2021-07-14 NOTE — Discharge Summary (Signed)
Physician Discharge Summary  Cody Yu GMW:[id number] DOB: Jan 19, 1986 DOA: 07/13/2021  PCP: Benita Stabile, MD  Admit date: 07/13/2021 Discharge date: 07/14/2021  Admitted From: Home Disposition: Home  Recommendations for Outpatient Follow-up:  Follow up with PCP in 1-2 weeks Please obtain BMP/CBC in one week your next doctors visit.  Decadron twice daily for 5 days.  Patient will use Tylenol/ibuprofen for pain control.  He does not want narcotics.  We will give him muscle relaxer to be used as needed. If fails to improve, may need outpatient neurosurgery referral   Discharge Condition: Stable CODE STATUS: Full code Diet recommendation: Regular  Brief/Interim Summary: 35 year old without any known past medical history fell off a horse and came to the hospital complaining of lower back pain.  Upon admission CT of the cervical spine, abdomen pelvis was negative.  Case was discussed by ER provider with neurosurgery who recommended obtaining MRI and starting patient on Decadron.  MRI showed central disc protrusion around lumbar spine without any cord compression.  PT evaluated the patient and he did really well with him.  Patient states he felt much better today and wanting to go home.  He will be discharged home today with recommendations as stated above. If his pain worsens, I have advised him to obtain referral by PCP to go see a neurosurgeon for any further management.  Body mass index is 32.66 kg/m.         Discharge Diagnoses:  Active Problems:   Fall      Consultations: Curbside neurosurgery  Subjective: Feels great and wishes to go home today  Discharge Exam: Vitals:   07/14/21 0552 07/14/21 1530  BP: 119/78 128/74  Pulse: (!) 57 68  Resp: 18 16  Temp: 97.8 F (36.6 C) 98.2 F (36.8 C)  SpO2: 98% 96%   Vitals:   07/14/21 0500 07/14/21 0552 07/14/21 0600 07/14/21 1530  BP: 113/73 119/78  128/74  Pulse: (!) 54 (!) 57  68  Resp: 11 18  16   Temp:  97.8  F (36.6 C)  98.2 F (36.8 C)  TempSrc:  Oral  Oral  SpO2: 97% 98%  96%  Weight:   112.3 kg   Height:   6\' 1"  (1.854 m)     General: Pt is alert, awake, not in acute distress Cardiovascular: RRR, S1/S2 +, no rubs, no gallops Respiratory: CTA bilaterally, no wheezing, no rhonchi Abdominal: Soft, NT, ND, bowel sounds + Extremities: no edema, no cyanosis  Discharge Instructions   Allergies as of 07/14/2021   No Active Allergies      Medication List     TAKE these medications    cyclobenzaprine 5 MG tablet Commonly known as: FLEXERIL Take 1 tablet (5 mg total) by mouth 3 (three) times daily as needed for muscle spasms.   dexamethasone 4 MG tablet Commonly known as: DECADRON Take 1 tablet (4 mg total) by mouth every 12 (twelve) hours for 5 days.   Vitamin D3 1.25 MG (50000 UT) Caps Take 1 capsule by mouth.        Follow-up Information     , MD Follow up in 1 week(s).   Specialty: Internal Medicine Contact information: [address] Benita Stabile [address] [place]ting [phone]                No Active Allergies  You were cared for by a hospitalist during your hospital stay. If you have any questions about your discharge medications or the care  you received while you were in the hospital after you are discharged, you can call the unit and asked to speak with the hospitalist on call if the hospitalist that took care of you is not available. Once you are discharged, your primary care physician will handle any further medical issues. Please note that no refills for any discharge medications will be authorized once you are discharged, as it is imperative that you return to your primary care physician (or establish a relationship with a primary care physician if you do not have one) for your aftercare needs so that they can reassess your need for medications and monitor your lab values.   Procedures/Studies: CT Head Wo Contrast  Result Date:  07/13/2021 CLINICAL DATA:  Thrown off horse.  Low back pain.  Did see stars. EXAM: CT HEAD WITHOUT CONTRAST CT CERVICAL SPINE WITHOUT CONTRAST CT CHEST, ABDOMEN AND PELVIS WITH CONTRAST TECHNIQUE: Contiguous axial images were obtained from the base of the skull through the vertex without intravenous contrast. Multidetector CT imaging of the cervical spine was performed without intravenous contrast. Multiplanar CT image reconstructions were also generated. Multidetector CT imaging of the chest, abdomen and pelvis was performed following the standard protocol during bolus administration of intravenous contrast. CONTRAST:  80mL OMNIPAQUE IOHEXOL 350 MG/ML SOLN COMPARISON:  None. FINDINGS: CT HEAD FINDINGS Brain: No evidence of large-territorial acute infarction. No parenchymal hemorrhage. No mass lesion. No extra-axial collection. No mass effect or midline shift. No hydrocephalus. Basilar cisterns are patent. Vascular: No hyperdense vessel. Skull: No acute fracture or focal lesion. Sinuses/Orbits: Paranasal sinuses and mastoid air cells are clear. The orbits are unremarkable. Other: None. CT CERVICAL FINDINGS Alignment: Straightening of the normal cervical lordosis likely due to positioning. Skull base and vertebrae: No acute fracture. No aggressive appearing focal osseous lesion or focal pathologic process. Soft tissues and spinal canal: No prevertebral fluid or swelling. No visible canal hematoma. Upper chest: Unremarkable. Other: None. CHEST: Ports and Devices: None. Lungs/airways: No focal consolidation. No pulmonary nodule. No pulmonary mass. No pulmonary contusion or laceration. No pneumatocele formation. The central airways are patent. Pleura: No pleural effusion. No pneumothorax. No hemothorax. Lymph Nodes: No mediastinal, hilar, or axillary lymphadenopathy. Mediastinum: Vague soft tissue density within the anterior mediastinum with associated vasculature likely residual thymus. No pneumomediastinum. No aortic  injury or mediastinal hematoma. The thoracic aorta is normal in caliber. The heart is normal in size. No significant pericardial effusion. The esophagus is unremarkable. Asymmetrically larger right thyroid gland compared to the left. Otherwise the thyroid is unremarkable. Chest Wall / Breasts: No chest wall mass.  Bilateral gynecomastia. Musculoskeletal: No acute rib or sternal fracture. No spinal fracture. Mild lower thoracic spine degenerative changes. ABDOMEN / PELVIS: Liver: Not enlarged. No focal lesion. No laceration or subcapsular hematoma. Biliary System: The gallbladder is otherwise unremarkable with no radio-opaque gallstones. No biliary ductal dilatation. Pancreas: Normal pancreatic contour. No main pancreatic duct dilatation. Spleen: Not enlarged. No focal lesion. No laceration, subcapsular hematoma, or vascular injury. A splenule is noted. Adrenal Glands: No nodularity bilaterally. Kidneys: Bilateral kidneys enhance symmetrically. No hydronephrosis. No contusion, laceration, or subcapsular hematoma. No injury to the vascular structures or collecting systems. No hydroureter. The urinary bladder is unremarkable. Bowel: No small or large bowel wall thickening or dilatation. The appendix is unremarkable. Mesentery, Omentum, and Peritoneum: No simple free fluid ascites. No pneumoperitoneum. No hemoperitoneum. No mesenteric hematoma identified. No organized fluid collection. Pelvic Organs: Normal. Lymph Nodes: No abdominal, pelvic, inguinal lymphadenopathy. Vasculature: No abdominal aorta  or iliac aneurysm. No active contrast extravasation or pseudoaneurysm. Musculoskeletal: No significant soft tissue hematoma. No acute pelvic fracture. No spinal fracture. IMPRESSION: 1. No acute intracranial abnormality. 2. No acute displaced fracture or traumatic listhesis of the cervical spine. 3.  No acute traumatic injury to the chest, abdomen, or pelvis. 4. No acute fracture or traumatic malalignment of the thoracic or  lumbar spine. Electronically Signed   By: Tish Frederickson M.D.   On: 07/13/2021 21:10   CT Cervical Spine Wo Contrast  Result Date: 07/13/2021 CLINICAL DATA:  Thrown off horse.  Low back pain.  Did see stars. EXAM: CT HEAD WITHOUT CONTRAST CT CERVICAL SPINE WITHOUT CONTRAST CT CHEST, ABDOMEN AND PELVIS WITH CONTRAST TECHNIQUE: Contiguous axial images were obtained from the base of the skull through the vertex without intravenous contrast. Multidetector CT imaging of the cervical spine was performed without intravenous contrast. Multiplanar CT image reconstructions were also generated. Multidetector CT imaging of the chest, abdomen and pelvis was performed following the standard protocol during bolus administration of intravenous contrast. CONTRAST:  54mL OMNIPAQUE IOHEXOL 350 MG/ML SOLN COMPARISON:  None. FINDINGS: CT HEAD FINDINGS Brain: No evidence of large-territorial acute infarction. No parenchymal hemorrhage. No mass lesion. No extra-axial collection. No mass effect or midline shift. No hydrocephalus. Basilar cisterns are patent. Vascular: No hyperdense vessel. Skull: No acute fracture or focal lesion. Sinuses/Orbits: Paranasal sinuses and mastoid air cells are clear. The orbits are unremarkable. Other: None. CT CERVICAL FINDINGS Alignment: Straightening of the normal cervical lordosis likely due to positioning. Skull base and vertebrae: No acute fracture. No aggressive appearing focal osseous lesion or focal pathologic process. Soft tissues and spinal canal: No prevertebral fluid or swelling. No visible canal hematoma. Upper chest: Unremarkable. Other: None. CHEST: Ports and Devices: None. Lungs/airways: No focal consolidation. No pulmonary nodule. No pulmonary mass. No pulmonary contusion or laceration. No pneumatocele formation. The central airways are patent. Pleura: No pleural effusion. No pneumothorax. No hemothorax. Lymph Nodes: No mediastinal, hilar, or axillary lymphadenopathy. Mediastinum: Vague  soft tissue density within the anterior mediastinum with associated vasculature likely residual thymus. No pneumomediastinum. No aortic injury or mediastinal hematoma. The thoracic aorta is normal in caliber. The heart is normal in size. No significant pericardial effusion. The esophagus is unremarkable. Asymmetrically larger right thyroid gland compared to the left. Otherwise the thyroid is unremarkable. Chest Wall / Breasts: No chest wall mass.  Bilateral gynecomastia. Musculoskeletal: No acute rib or sternal fracture. No spinal fracture. Mild lower thoracic spine degenerative changes. ABDOMEN / PELVIS: Liver: Not enlarged. No focal lesion. No laceration or subcapsular hematoma. Biliary System: The gallbladder is otherwise unremarkable with no radio-opaque gallstones. No biliary ductal dilatation. Pancreas: Normal pancreatic contour. No main pancreatic duct dilatation. Spleen: Not enlarged. No focal lesion. No laceration, subcapsular hematoma, or vascular injury. A splenule is noted. Adrenal Glands: No nodularity bilaterally. Kidneys: Bilateral kidneys enhance symmetrically. No hydronephrosis. No contusion, laceration, or subcapsular hematoma. No injury to the vascular structures or collecting systems. No hydroureter. The urinary bladder is unremarkable. Bowel: No small or large bowel wall thickening or dilatation. The appendix is unremarkable. Mesentery, Omentum, and Peritoneum: No simple free fluid ascites. No pneumoperitoneum. No hemoperitoneum. No mesenteric hematoma identified. No organized fluid collection. Pelvic Organs: Normal. Lymph Nodes: No abdominal, pelvic, inguinal lymphadenopathy. Vasculature: No abdominal aorta or iliac aneurysm. No active contrast extravasation or pseudoaneurysm. Musculoskeletal: No significant soft tissue hematoma. No acute pelvic fracture. No spinal fracture. IMPRESSION: 1. No acute intracranial abnormality. 2. No acute displaced fracture  or traumatic listhesis of the cervical  spine. 3.  No acute traumatic injury to the chest, abdomen, or pelvis. 4. No acute fracture or traumatic malalignment of the thoracic or lumbar spine. Electronically Signed   By: Tish Frederickson M.D.   On: 07/13/2021 21:10   MR LUMBAR SPINE WO CONTRAST  Result Date: 07/14/2021 CLINICAL DATA:  Back trauma, abnormal neuro exam, CT or xray positive (Age >= 16y) EXAM: MRI LUMBAR SPINE WITHOUT CONTRAST TECHNIQUE: Multiplanar, multisequence MR imaging of the lumbar spine was performed. No intravenous contrast was administered. COMPARISON:  None. FINDINGS: Segmentation: Standard segmentation is assumed. The inferior-most fully formed intervertebral disc is labeled L5-S1. Alignment:  No substantial sagittal subluxation. Vertebrae: Vertebral body heights are maintained. 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 No suspicious bone lesions. No focal marrow edema to suggest acute fracture or discitis/osteomyelitis. Conus medullaris and cauda equina: Conus extends to the superior T12 level. Conus appears normal. Paraspinal and other soft tissues: No appreciable paraspinal edema. Disc levels: T12-L1: No significant disc protrusion, foraminal stenosis, or canal stenosis. L1-L2: No significant disc protrusion, foraminal stenosis, or canal stenosis. L2-L3: No significant disc protrusion, foraminal stenosis, or canal stenosis. L3-L4: No significant disc protrusion, foraminal stenosis, or canal stenosis. L4-L5: Small central disc protrusion with annular fissure. Mild facet arthropathy. Mild bilateral subarticular recess stenosis. No significant canal or foraminal stenosis. L5-S1: Disc desiccation and height loss. Mild broad disc bulge with superimposed central disc protrusion with annular fissure. Mild bilateral foraminal stenosis. No significant canal stenosis. IMPRESSION: 1. At L5-S1, degenerative disc disease with central disc protrusion and annular fissure. Mild bilateral foraminal stenosis without significant canal  stenosis. 2. At L4-L5, central disc protrusion with mild bilateral subarticular recess stenosis. No significant canal or foraminal stenosis. Electronically Signed   By: Feliberto Harts M.D.   On: 07/14/2021 14:01   CT CHEST ABDOMEN PELVIS W CONTRAST  Result Date: 07/13/2021 CLINICAL DATA:  Thrown off horse.  Low back pain.  Did see stars. EXAM: CT HEAD WITHOUT CONTRAST CT CERVICAL SPINE WITHOUT CONTRAST CT CHEST, ABDOMEN AND PELVIS WITH CONTRAST TECHNIQUE: Contiguous axial images were obtained from the base of the skull through the vertex without intravenous contrast. Multidetector CT imaging of the cervical spine was performed without intravenous contrast. Multiplanar CT image reconstructions were also generated. Multidetector CT imaging of the chest, abdomen and pelvis was performed following the standard protocol during bolus administration of intravenous contrast. CONTRAST:  23mL OMNIPAQUE IOHEXOL 350 MG/ML SOLN COMPARISON:  None. FINDINGS: CT HEAD FINDINGS Brain: No evidence of large-territorial acute infarction. No parenchymal hemorrhage. No mass lesion. No extra-axial collection. No mass effect or midline shift. No hydrocephalus. Basilar cisterns are patent. Vascular: No hyperdense vessel. Skull: No acute fracture or focal lesion. Sinuses/Orbits: Paranasal sinuses and mastoid air cells are clear. The orbits are unremarkable. Other: None. CT CERVICAL FINDINGS Alignment: Straightening of the normal cervical lordosis likely due to positioning. Skull base and vertebrae: No acute fracture. No aggressive appearing focal osseous lesion or focal pathologic process. Soft tissues and spinal canal: No prevertebral fluid or swelling. No visible canal hematoma. Upper chest: Unremarkable. Other: None. CHEST: Ports and Devices: None. Lungs/airways: No focal consolidation. No pulmonary nodule. No pulmonary mass. No pulmonary contusion or laceration. No pneumatocele formation. The central airways are patent. Pleura: No  pleural effusion. No pneumothorax. No hemothorax. Lymph Nodes: No mediastinal, hilar, or axillary lymphadenopathy. Mediastinum: Vague soft tissue density within the anterior mediastinum with associated vasculature likely residual thymus. No pneumomediastinum. No aortic injury or  mediastinal hematoma. The thoracic aorta is normal in caliber. The heart is normal in size. No significant pericardial effusion. The esophagus is unremarkable. Asymmetrically larger right thyroid gland compared to the left. Otherwise the thyroid is unremarkable. Chest Wall / Breasts: No chest wall mass.  Bilateral gynecomastia. Musculoskeletal: No acute rib or sternal fracture. No spinal fracture. Mild lower thoracic spine degenerative changes. ABDOMEN / PELVIS: Liver: Not enlarged. No focal lesion. No laceration or subcapsular hematoma. Biliary System: The gallbladder is otherwise unremarkable with no radio-opaque gallstones. No biliary ductal dilatation. Pancreas: Normal pancreatic contour. No main pancreatic duct dilatation. Spleen: Not enlarged. No focal lesion. No laceration, subcapsular hematoma, or vascular injury. A splenule is noted. Adrenal Glands: No nodularity bilaterally. Kidneys: Bilateral kidneys enhance symmetrically. No hydronephrosis. No contusion, laceration, or subcapsular hematoma. No injury to the vascular structures or collecting systems. No hydroureter. The urinary bladder is unremarkable. Bowel: No small or large bowel wall thickening or dilatation. The appendix is unremarkable. Mesentery, Omentum, and Peritoneum: No simple free fluid ascites. No pneumoperitoneum. No hemoperitoneum. No mesenteric hematoma identified. No organized fluid collection. Pelvic Organs: Normal. Lymph Nodes: No abdominal, pelvic, inguinal lymphadenopathy. Vasculature: No abdominal aorta or iliac aneurysm. No active contrast extravasation or pseudoaneurysm. Musculoskeletal: No significant soft tissue hematoma. No acute pelvic fracture. No  spinal fracture. IMPRESSION: 1. No acute intracranial abnormality. 2. No acute displaced fracture or traumatic listhesis of the cervical spine. 3.  No acute traumatic injury to the chest, abdomen, or pelvis. 4. No acute fracture or traumatic malalignment of the thoracic or lumbar spine. Electronically Signed   By: Tish Frederickson M.D.   On: 07/13/2021 21:10     The results of significant diagnostics from this hospitalization (including imaging, microbiology, ancillary and laboratory) are listed below for reference.     Microbiology: Recent Results (from the past 240 hour(s))  Resp Panel by RT-PCR (Flu A&B, Covid) Nasopharyngeal Swab     Status: None   Collection Time: 07/13/21 11:55 PM   Specimen: Nasopharyngeal Swab; Nasopharyngeal(NP) swabs in vial transport medium  Result Value Ref Range Status   SARS Coronavirus 2 by RT PCR NEGATIVE NEGATIVE Final    Comment: (NOTE) SARS-CoV-2 target nucleic acids are NOT DETECTED.  The SARS-CoV-2 RNA is generally detectable in upper respiratory specimens during the acute phase of infection. The lowest concentration of SARS-CoV-2 viral copies this assay can detect is 138 copies/mL. A negative result does not preclude SARS-Cov-2 infection and should not be used as the sole basis for treatment or other patient management decisions. A negative result may occur with  improper specimen collection/handling, submission of specimen other than nasopharyngeal swab, presence of viral mutation(s) within the areas targeted by this assay, and inadequate number of viral copies(<138 copies/mL). A negative result must be combined with clinical observations, patient history, and epidemiological information. The expected result is Negative.  Fact Sheet for Patients:  BloggerCourse.com  Fact Sheet for Healthcare Providers:  SeriousBroker.it  This test is no t yet approved or cleared by the [place] FDA and   has been authorized for detection and/or diagnosis of SARS-CoV-2 by FDA under an Emergency Use Authorization (EUA). This EUA will remain  in effect (meaning this test can be used) for the duration of the COVID-19 declaration under Section 564(b)(1) of the Act, 21 U.S.C.section 360bbb-3(b)(1), unless the authorization is terminated  or revoked sooner.       Influenza A by PCR NEGATIVE NEGATIVE Final   Influenza B by PCR NEGATIVE NEGATIVE Final  Comment: (NOTE) The Xpert Xpress SARS-CoV-2/FLU/RSV plus assay is intended as an aid in the diagnosis of influenza from Nasopharyngeal swab specimens and should not be used as a sole basis for treatment. Nasal washings and aspirates are unacceptable for Xpert Xpress SARS-CoV-2/FLU/RSV testing.  Fact Sheet for Patients: BloggerCourse.com  Fact Sheet for Healthcare Providers: SeriousBroker.it  This test is not yet approved or cleared by the [place] FDA and has been authorized for detection and/or diagnosis of SARS-CoV-2 by FDA under an Emergency Use Authorization (EUA). This EUA will remain in effect (meaning this test can be used) for the duration of the COVID-19 declaration under Section 564(b)(1) of the Act, 21 U.S.C. section 360bbb-3(b)(1), unless the authorization is terminated or revoked.  Performed at [facility], [address] [place], [place] [address]      Labs: BNP (last 3 results) No results for input(s): BNP in the last 8760 hours. Basic Metabolic Panel: Recent Labs  Lab 07/13/21 2024 07/14/21 0541  NA 136 138  K 4.0 4.5  CL 106 103  CO2 25 28  GLUCOSE 97 143*  BUN 20 18  CREATININE 1.23 1.11  CALCIUM 8.4* 9.2  MG  --  2.0   Liver Function Tests: Recent Labs  Lab 07/13/21 2024 07/14/21 0541  AST 119* 107*  ALT 59* 61*  ALKPHOS 60 72  BILITOT 0.4 0.7  PROT 6.7 7.5  ALBUMIN 3.9 4.3   No results for input(s): LIPASE, AMYLASE in the last 168  hours. No results for input(s): AMMONIA in the last 168 hours. CBC: Recent Labs  Lab 07/13/21 2024 07/14/21 0541  WBC 9.2 10.9*  NEUTROABS 6.1 9.6*  HGB 13.4 15.0  HCT 41.6 47.2  MCV 84.4 85.4  PLT 247 257   Cardiac Enzymes: No results for input(s): CKTOTAL, CKMB, CKMBINDEX, TROPONINI in the last 168 hours. BNP: Invalid input(s): POCBNP CBG: No results for input(s): GLUCAP in the last 168 hours. D-Dimer No results for input(s): DDIMER in the last 72 hours. Hgb A1c No results for input(s): HGBA1C in the last 72 hours. Lipid Profile No results for input(s): CHOL, HDL, LDLCALC, TRIG, CHOLHDL, LDLDIRECT in the last 72 hours. Thyroid function studies No results for input(s): TSH, T4TOTAL, T3FREE, THYROIDAB in the last 72 hours.  Invalid input(s): FREET3 Anemia work up No results for input(s): VITAMINB12, FOLATE, FERRITIN, TIBC, IRON, RETICCTPCT in the last 72 hours. Urinalysis No results found for: COLORURINE, APPEARANCEUR, LABSPEC, PHURINE, GLUCOSEU, HGBUR, BILIRUBINUR, KETONESUR, PROTEINUR, UROBILINOGEN, NITRITE, LEUKOCYTESUR Sepsis Labs Invalid input(s): PROCALCITONIN,  WBC,  LACTICIDVEN Microbiology Recent Results (from the past 240 hour(s))  Resp Panel by RT-PCR (Flu A&B, Covid) Nasopharyngeal Swab     Status: None   Collection Time: 07/13/21 11:55 PM   Specimen: Nasopharyngeal Swab; Nasopharyngeal(NP) swabs in vial transport medium  Result Value Ref Range Status   SARS Coronavirus 2 by RT PCR NEGATIVE NEGATIVE Final    Comment: (NOTE) SARS-CoV-2 target nucleic acids are NOT DETECTED.  The SARS-CoV-2 RNA is generally detectable in upper respiratory specimens during the acute phase of infection. The lowest concentration of SARS-CoV-2 viral copies this assay can detect is 138 copies/mL. A negative result does not preclude SARS-Cov-2 infection and should not be used as the sole basis for treatment or other patient management decisions. A negative result may occur with   improper specimen collection/handling, submission of specimen other than nasopharyngeal swab, presence of viral mutation(s) within the areas targeted by this assay, and inadequate number of viral copies(<138 copies/mL). A negative  result must be combined with clinical observations, patient history, and epidemiological information. The expected result is Negative.  Fact Sheet for Patients:  BloggerCourse.com  Fact Sheet for Healthcare Providers:  SeriousBroker.it  This test is no t yet approved or cleared by the [place] FDA and  has been authorized for detection and/or diagnosis of SARS-CoV-2 by FDA under an Emergency Use Authorization (EUA). This EUA will remain  in effect (meaning this test can be used) for the duration of the COVID-19 declaration under Section 564(b)(1) of the Act, 21 U.S.C.section 360bbb-3(b)(1), unless the authorization is terminated  or revoked sooner.       Influenza A by PCR NEGATIVE NEGATIVE Final   Influenza B by PCR NEGATIVE NEGATIVE Final    Comment: (NOTE) The Xpert Xpress SARS-CoV-2/FLU/RSV plus assay is intended as an aid in the diagnosis of influenza from Nasopharyngeal swab specimens and should not be used as a sole basis for treatment. Nasal washings and aspirates are unacceptable for Xpert Xpress SARS-CoV-2/FLU/RSV testing.  Fact Sheet for Patients: BloggerCourse.com  Fact Sheet for Healthcare Providers: SeriousBroker.it  This test is not yet approved or cleared by the [place] FDA and has been authorized for detection and/or diagnosis of SARS-CoV-2 by FDA under an Emergency Use Authorization (EUA). This EUA will remain in effect (meaning this test can be used) for the duration of the COVID-19 declaration under Section 564(b)(1) of the Act, 21 U.S.C. section 360bbb-3(b)(1), unless the authorization is terminated  or revoked.  Performed at [facility], [address] [place], [place] [address]      Time coordinating discharge:  I have spent 35 minutes face to face with the patient and on the ward discussing the patients care, assessment, plan and disposition with other care givers. >50% of the time was devoted counseling the patient about the risks and benefits of treatment/Discharge disposition and coordinating care.   SIGNED:   Dimple Nanas, MD  [facility] 07/14/2021, 4:19 PM   If 7PM-7AM, please contact night-coverage

## 2021-07-14 NOTE — Progress Notes (Signed)
35 year old with no known past medical history comes to the hospital after being falling off his horse.  He immediately started having lower back pain radiating up to the spine and to his lower extremities.  CT of the C-spine, abdomen and pelvis was negative for acute pathology.  Case was discussed by admitting provider with neurosurgery who recommended obtaining MRI back and starting Decadron.  During my evaluation this morning he stated his back pain was controlled as long as he is resting.  His wife was present at bedside.  Vital signs are stable.  No tenderness noted upon palpation along his spine.  His pain likely appears to be neuropathic.  Without any acute severe pathology with MRI lumbar spine.  Continue steroids.  He does not want to use narcotics therefore we will control his pain with Tylenol and ibuprofen.  Add Flexeril.  PT/OT.  Call with further questions as needed.  Stephania Fragmin MD [facility]

## 2021-07-14 NOTE — H&P (Signed)
TRH H&P    Patient Demographics:    Cody Yu, is a 35 y.o. male  MRN: [id number]  DOB - [birth year]  Admit Date - 07/13/2021  Referring MD/NP/PA: Wilkie Aye  Outpatient Primary MD for the patient is Benita Stabile, MD  Patient coming from: Home  Chief complaint-fell from horse   HPI:    Cody Yu  is a 34 y.o. male, who is a healthy 35 year old male without known medical history, who works in healthcare, who presents to the ED with a chief complaint of being thrown from horse.  Patient reports that for started biking and patient fell landing flat on his back on the ground.  Patient reports that he was dizzy, but did not lose consciousness.  He is not sure if he hit his head.  He denies any nausea, vomiting, changes in vision, change in hearing.  He reports since then he has had sharp pain shooting down his right leg.  He had sciatic pain 2 years ago, and it feels similar to that.  In the past he had steroid course for sciatica which only resolved the pain for 2 weeks.  When it returned he went for HVLA with a chiropractor. Difference with today's pain is instead of coming from his dorsal sides coming more from his hip flexors.  It is sharp and is worse when he is trying to move.  It is better when he is at rest.  Patient reports that other than this he was in his normal state of health.  Patient does not drink, does not smoke, does not use alcohol.  He is not vaccinated for COVID.  Patient is full code.  In the ED temp 98.1, heart rate 50-70, respiratory rate 10-19, blood pressure 118/72 No leukocytosis, hemoglobin 13.4 Chemistry panel shows a slight bump in creatinine at 1.23-no comparisons AST slightly elevated 119, ALT 59 CT C-spine, abdomen, pelvis shows no acute displaced fracture or traumatic listhesis of the C-spine.  No acute trauma to chest or abdomen or pelvis Patient was given Dilaudid, morphine,  half a liter of normal saline Neurosurgery was consulted who recommended patient stay here at [facility] and if he still has pain in the morning to get an MRI of his back and also recommended starting Decadron    Review of systems:    In addition to the HPI above,  No Fever-chills, No Headache, No changes with Vision or hearing, No problems swallowing food or Liquids, No Chest pain, Cough or Shortness of Breath, No Abdominal pain, No Nausea or Vomiting, bowel movements are regular, No Blood in stool or Urine, No dysuria, No new skin rashes or bruises, No new weakness, tingling, numbness in any extremity, No recent weight gain or loss, No polyuria, polydypsia or polyphagia, No significant Mental Stressors.  All other systems reviewed and are negative.    Past History of the following :    History reviewed. No pertinent past medical history.    Past Surgical History:  Procedure Laterality Date   KNEE SURGERY  2 on each knee      Social History:      Social History   Tobacco Use   Smoking status: Never   Smokeless tobacco: Not on file  Substance Use Topics   Alcohol use: No       Family History :    History reviewed. No pertinent family history. Family history hypertension   Home Medications:   Prior to Admission medications   Medication Sig Start Date End Date Taking? Authorizing Provider  Cholecalciferol (VITAMIN D3) 1.25 MG (50000 UT) CAPS Take 1 capsule by mouth.    Wilson Singer, MD     Allergies:    No Active Allergies   Physical Exam:   Vitals  Blood pressure 113/76, pulse (!) 47, temperature 98.1 F (36.7 C), temperature source Oral, resp. rate 11, height  (1.854 m), weight 108.9 kg, SpO2 97 %.  1.  General: Patient lying supine in bed,  no acute distress   2. Psychiatric: Alert and oriented x 3, mood and behavior normal for situation, pleasant and cooperative with exam   3. Neurologic: Speech and language are normal,  face is symmetric, moves all 4 extremities voluntarily, right leg has less resistance than left leg on neuro exam, but this is felt to be secondary to pain and not to weakness   4. HEENMT:  Head is atraumatic, normocephalic, pupils reactive to light, neck is supple, trachea is midline, mucous membranes are moist   5. Respiratory : Lungs are clear to auscultation bilaterally without wheezing, rhonchi, rales, no cyanosis, no increase in work of breathing or accessory muscle use   6. Cardiovascular : Heart rate normal, rhythm is regular, no murmurs, rubs or gallops, no peripheral edema, peripheral pulses palpated   7. Gastrointestinal:  Abdomen is soft, nondistended, nontender to palpation bowel sounds active, no masses or organomegaly palpated   8. Skin:  Skin is warm, dry and intact without rashes, acute lesions, or ulcers on limited exam   9.Musculoskeletal:  No acute deformities or trauma, no asymmetry in tone, no peripheral edema, peripheral pulses palpated, no tenderness to palpation in the extremities    Data Review:    CBC Recent Labs  Lab 07/13/21 2024  WBC 9.2  HGB 13.4  HCT 41.6  PLT 247  MCV 84.4  MCH 27.2  MCHC 32.2  RDW 13.5  LYMPHSABS 2.1  MONOABS 0.7  EOSABS 0.2  BASOSABS 0.1   ------------------------------------------------------------------------------------------------------------------  Results for orders placed or performed during the hospital encounter of 07/13/21 (from the past 48 hour(s))  CBC with Differential     Status: None   Collection Time: 07/13/21  8:24 PM  Result Value Ref Range   WBC 9.2 4.0 - 10.5 K/uL   RBC 4.93 4.22 - 5.81 MIL/uL   Hemoglobin 13.4 13.0 - 17.0 g/dL   HCT 16.1 09.6 - 04.5 %   MCV 84.4 80.0 - 100.0 fL   MCH 27.2 26.0 - 34.0 pg   MCHC 32.2 30.0 - 36.0 g/dL   RDW 40.9 81.1 - 91.4 %   Platelets 247 150 - 400 K/uL   nRBC 0.0 0.0 - 0.2 %   Neutrophils Relative % 66 %   Neutro Abs 6.1 1.7 - 7.7 K/uL   Lymphocytes  Relative 23 %   Lymphs Abs 2.1 0.7 - 4.0 K/uL   Monocytes Relative 8 %   Monocytes Absolute 0.7 0.1 - 1.0 K/uL   Eosinophils Relative 2 %   Eosinophils Absolute 0.2 0.0 -  0.5 K/uL   Basophils Relative 1 %   Basophils Absolute 0.1 0.0 - 0.1 K/uL   Immature Granulocytes 0 %   Abs Immature Granulocytes 0.03 0.00 - 0.07 K/uL    Comment: Performed at [facility], [address] [place], [place] [address]  Comprehensive metabolic panel     Status: Abnormal   Collection Time: 07/13/21  8:24 PM  Result Value Ref Range   Sodium 136 135 - 145 mmol/L   Potassium 4.0 3.5 - 5.1 mmol/L   Chloride 106 98 - 111 mmol/L   CO2 25 22 - 32 mmol/L   Glucose, Bld 97 70 - 99 mg/dL    Comment: Glucose reference range applies only to samples taken after fasting for at least 8 hours.   BUN 20 6 - 20 mg/dL   Creatinine, Ser 3.50 0.61 - 1.24 mg/dL   Calcium 8.4 (L) 8.9 - 10.3 mg/dL   Total Protein 6.7 6.5 - 8.1 g/dL   Albumin 3.9 3.5 - 5.0 g/dL   AST 093 (H) 15 - 41 U/L   ALT 59 (H) 0 - 44 U/L   Alkaline Phosphatase 60 38 - 126 U/L   Total Bilirubin 0.4 0.3 - 1.2 mg/dL   GFR, Estimated >81 >82 mL/min    Comment: (NOTE) Calculated using the CKD-EPI Creatinine Equation (2021)    Anion gap 5 5 - 15    Comment: Performed at [facility], [address], [place], [place] [address]  Protime-INR     Status: None   Collection Time: 07/13/21  8:24 PM  Result Value Ref Range   Prothrombin Time 13.8 11.4 - 15.2 seconds   INR 1.1 0.8 - 1.2    Comment: (NOTE) INR goal varies based on device and disease states. Performed at [facility], [address] [place], [place] [address]   Resp Panel by RT-PCR (Flu A&B, Covid) Nasopharyngeal Swab     Status: None   Collection Time: 07/13/21 11:55 PM   Specimen: Nasopharyngeal Swab; Nasopharyngeal(NP) swabs in vial transport medium  Result Value Ref Range   SARS Coronavirus 2 by RT PCR NEGATIVE NEGATIVE    Comment: (NOTE) SARS-CoV-2 target nucleic acids are NOT  DETECTED.  The SARS-CoV-2 RNA is generally detectable in upper respiratory specimens during the acute phase of infection. The lowest concentration of SARS-CoV-2 viral copies this assay can detect is 138 copies/mL. A negative result does not preclude SARS-Cov-2 infection and should not be used as the sole basis for treatment or other patient management decisions. A negative result may occur with  improper specimen collection/handling, submission of specimen other than nasopharyngeal swab, presence of viral mutation(s) within the areas targeted by this assay, and inadequate number of viral copies(<138 copies/mL). A negative result must be combined with clinical observations, patient history, and epidemiological information. The expected result is Negative.  Fact Sheet for Patients:  BloggerCourse.com  Fact Sheet for Healthcare Providers:  SeriousBroker.it  This test is no t yet approved or cleared by the [place] FDA and  has been authorized for detection and/or diagnosis of SARS-CoV-2 by FDA under an Emergency Use Authorization (EUA). This EUA will remain  in effect (meaning this test can be used) for the duration of the COVID-19 declaration under Section 564(b)(1) of the Act, 21 U.S.C.section 360bbb-3(b)(1), unless the authorization is terminated  or revoked sooner.       Influenza A by PCR NEGATIVE NEGATIVE   Influenza B by PCR NEGATIVE NEGATIVE    Comment: (NOTE) The  Xpert Xpress SARS-CoV-2/FLU/RSV plus assay is intended as an aid in the diagnosis of influenza from Nasopharyngeal swab specimens and should not be used as a sole basis for treatment. Nasal washings and aspirates are unacceptable for Xpert Xpress SARS-CoV-2/FLU/RSV testing.  Fact Sheet for Patients: BloggerCourse.com  Fact Sheet for Healthcare Providers: SeriousBroker.it  This test is not yet approved or  cleared by the [place] FDA and has been authorized for detection and/or diagnosis of SARS-CoV-2 by FDA under an Emergency Use Authorization (EUA). This EUA will remain in effect (meaning this test can be used) for the duration of the COVID-19 declaration under Section 564(b)(1) of the Act, 21 U.S.C. section 360bbb-3(b)(1), unless the authorization is terminated or revoked.  Performed at [facility], [address], [place], [place] [address]     Chemistries  Recent Labs  Lab 07/13/21 2024  NA 136  K 4.0  CL 106  CO2 25  GLUCOSE 97  BUN 20  CREATININE 1.23  CALCIUM 8.4*  AST 119*  ALT 59*  ALKPHOS 60  BILITOT 0.4   ------------------------------------------------------------------------------------------------------------------  ------------------------------------------------------------------------------------------------------------------ GFR: Estimated Creatinine Clearance: 107.5 mL/min (by C-G formula based on SCr of 1.23 mg/dL). Liver Function Tests: Recent Labs  Lab 07/13/21 2024  AST 119*  ALT 59*  ALKPHOS 60  BILITOT 0.4  PROT 6.7  ALBUMIN 3.9   No results for input(s): LIPASE, AMYLASE in the last 168 hours. No results for input(s): AMMONIA in the last 168 hours. Coagulation Profile: Recent Labs  Lab 07/13/21 2024  INR 1.1   Cardiac Enzymes: No results for input(s): CKTOTAL, CKMB, CKMBINDEX, TROPONINI in the last 168 hours. BNP (last 3 results) No results for input(s): PROBNP in the last 8760 hours. HbA1C: No results for input(s): HGBA1C in the last 72 hours. CBG: No results for input(s): GLUCAP in the last 168 hours. Lipid Profile: No results for input(s): CHOL, HDL, LDLCALC, TRIG, CHOLHDL, LDLDIRECT in the last 72 hours. Thyroid Function Tests: No results for input(s): TSH, T4TOTAL, FREET4, T3FREE, THYROIDAB in the last 72 hours. Anemia Panel: No results for input(s): VITAMINB12, FOLATE, FERRITIN, TIBC, IRON, RETICCTPCT in the last 72  hours.  --------------------------------------------------------------------------------------------------------------- Urine analysis: No results found for: COLORURINE, APPEARANCEUR, LABSPEC, PHURINE, GLUCOSEU, HGBUR, BILIRUBINUR, KETONESUR, PROTEINUR, UROBILINOGEN, NITRITE, LEUKOCYTESUR    Imaging Results:    CT Head Wo Contrast  Result Date: 07/13/2021 CLINICAL DATA:  Thrown off horse.  Low back pain.  Did see stars. EXAM: CT HEAD WITHOUT CONTRAST CT CERVICAL SPINE WITHOUT CONTRAST CT CHEST, ABDOMEN AND PELVIS WITH CONTRAST TECHNIQUE: Contiguous axial images were obtained from the base of the skull through the vertex without intravenous contrast. Multidetector CT imaging of the cervical spine was performed without intravenous contrast. Multiplanar CT image reconstructions were also generated. Multidetector CT imaging of the chest, abdomen and pelvis was performed following the standard protocol during bolus administration of intravenous contrast. CONTRAST:  20mL OMNIPAQUE IOHEXOL 350 MG/ML SOLN COMPARISON:  None. FINDINGS: CT HEAD FINDINGS Brain: No evidence of large-territorial acute infarction. No parenchymal hemorrhage. No mass lesion. No extra-axial collection. No mass effect or midline shift. No hydrocephalus. Basilar cisterns are patent. Vascular: No hyperdense vessel. Skull: No acute fracture or focal lesion. Sinuses/Orbits: Paranasal sinuses and mastoid air cells are clear. The orbits are unremarkable. Other: None. CT CERVICAL FINDINGS Alignment: Straightening of the normal cervical lordosis likely due to positioning. Skull base and vertebrae: No acute fracture. No aggressive appearing focal osseous lesion or focal pathologic process. Soft tissues and spinal canal: No  prevertebral fluid or swelling. No visible canal hematoma. Upper chest: Unremarkable. Other: None. CHEST: Ports and Devices: None. Lungs/airways: No focal consolidation. No pulmonary nodule. No pulmonary mass. No pulmonary  contusion or laceration. No pneumatocele formation. The central airways are patent. Pleura: No pleural effusion. No pneumothorax. No hemothorax. Lymph Nodes: No mediastinal, hilar, or axillary lymphadenopathy. Mediastinum: Vague soft tissue density within the anterior mediastinum with associated vasculature likely residual thymus. No pneumomediastinum. No aortic injury or mediastinal hematoma. The thoracic aorta is normal in caliber. The heart is normal in size. No significant pericardial effusion. The esophagus is unremarkable. Asymmetrically larger right thyroid gland compared to the left. Otherwise the thyroid is unremarkable. Chest Wall / Breasts: No chest wall mass.  Bilateral gynecomastia. Musculoskeletal: No acute rib or sternal fracture. No spinal fracture. Mild lower thoracic spine degenerative changes. ABDOMEN / PELVIS: Liver: Not enlarged. No focal lesion. No laceration or subcapsular hematoma. Biliary System: The gallbladder is otherwise unremarkable with no radio-opaque gallstones. No biliary ductal dilatation. Pancreas: Normal pancreatic contour. No main pancreatic duct dilatation. Spleen: Not enlarged. No focal lesion. No laceration, subcapsular hematoma, or vascular injury. A splenule is noted. Adrenal Glands: No nodularity bilaterally. Kidneys: Bilateral kidneys enhance symmetrically. No hydronephrosis. No contusion, laceration, or subcapsular hematoma. No injury to the vascular structures or collecting systems. No hydroureter. The urinary bladder is unremarkable. Bowel: No small or large bowel wall thickening or dilatation. The appendix is unremarkable. Mesentery, Omentum, and Peritoneum: No simple free fluid ascites. No pneumoperitoneum. No hemoperitoneum. No mesenteric hematoma identified. No organized fluid collection. Pelvic Organs: Normal. Lymph Nodes: No abdominal, pelvic, inguinal lymphadenopathy. Vasculature: No abdominal aorta or iliac aneurysm. No active contrast extravasation or  pseudoaneurysm. Musculoskeletal: No significant soft tissue hematoma. No acute pelvic fracture. No spinal fracture. IMPRESSION: 1. No acute intracranial abnormality. 2. No acute displaced fracture or traumatic listhesis of the cervical spine. 3.  No acute traumatic injury to the chest, abdomen, or pelvis. 4. No acute fracture or traumatic malalignment of the thoracic or lumbar spine. Electronically Signed   By: Tish Frederickson M.D.   On: 07/13/2021 21:10   CT Cervical Spine Wo Contrast  Result Date: 07/13/2021 CLINICAL DATA:  Thrown off horse.  Low back pain.  Did see stars. EXAM: CT HEAD WITHOUT CONTRAST CT CERVICAL SPINE WITHOUT CONTRAST CT CHEST, ABDOMEN AND PELVIS WITH CONTRAST TECHNIQUE: Contiguous axial images were obtained from the base of the skull through the vertex without intravenous contrast. Multidetector CT imaging of the cervical spine was performed without intravenous contrast. Multiplanar CT image reconstructions were also generated. Multidetector CT imaging of the chest, abdomen and pelvis was performed following the standard protocol during bolus administration of intravenous contrast. CONTRAST:  62mL OMNIPAQUE IOHEXOL 350 MG/ML SOLN COMPARISON:  None. FINDINGS: CT HEAD FINDINGS Brain: No evidence of large-territorial acute infarction. No parenchymal hemorrhage. No mass lesion. No extra-axial collection. No mass effect or midline shift. No hydrocephalus. Basilar cisterns are patent. Vascular: No hyperdense vessel. Skull: No acute fracture or focal lesion. Sinuses/Orbits: Paranasal sinuses and mastoid air cells are clear. The orbits are unremarkable. Other: None. CT CERVICAL FINDINGS Alignment: Straightening of the normal cervical lordosis likely due to positioning. Skull base and vertebrae: No acute fracture. No aggressive appearing focal osseous lesion or focal pathologic process. Soft tissues and spinal canal: No prevertebral fluid or swelling. No visible canal hematoma. Upper chest:  Unremarkable. Other: None. CHEST: Ports and Devices: None. Lungs/airways: No focal consolidation. No pulmonary nodule. No pulmonary mass. No pulmonary contusion or  laceration. No pneumatocele formation. The central airways are patent. Pleura: No pleural effusion. No pneumothorax. No hemothorax. Lymph Nodes: No mediastinal, hilar, or axillary lymphadenopathy. Mediastinum: Vague soft tissue density within the anterior mediastinum with associated vasculature likely residual thymus. No pneumomediastinum. No aortic injury or mediastinal hematoma. The thoracic aorta is normal in caliber. The heart is normal in size. No significant pericardial effusion. The esophagus is unremarkable. Asymmetrically larger right thyroid gland compared to the left. Otherwise the thyroid is unremarkable. Chest Wall / Breasts: No chest wall mass.  Bilateral gynecomastia. Musculoskeletal: No acute rib or sternal fracture. No spinal fracture. Mild lower thoracic spine degenerative changes. ABDOMEN / PELVIS: Liver: Not enlarged. No focal lesion. No laceration or subcapsular hematoma. Biliary System: The gallbladder is otherwise unremarkable with no radio-opaque gallstones. No biliary ductal dilatation. Pancreas: Normal pancreatic contour. No main pancreatic duct dilatation. Spleen: Not enlarged. No focal lesion. No laceration, subcapsular hematoma, or vascular injury. A splenule is noted. Adrenal Glands: No nodularity bilaterally. Kidneys: Bilateral kidneys enhance symmetrically. No hydronephrosis. No contusion, laceration, or subcapsular hematoma. No injury to the vascular structures or collecting systems. No hydroureter. The urinary bladder is unremarkable. Bowel: No small or large bowel wall thickening or dilatation. The appendix is unremarkable. Mesentery, Omentum, and Peritoneum: No simple free fluid ascites. No pneumoperitoneum. No hemoperitoneum. No mesenteric hematoma identified. No organized fluid collection. Pelvic Organs: Normal. Lymph  Nodes: No abdominal, pelvic, inguinal lymphadenopathy. Vasculature: No abdominal aorta or iliac aneurysm. No active contrast extravasation or pseudoaneurysm. Musculoskeletal: No significant soft tissue hematoma. No acute pelvic fracture. No spinal fracture. IMPRESSION: 1. No acute intracranial abnormality. 2. No acute displaced fracture or traumatic listhesis of the cervical spine. 3.  No acute traumatic injury to the chest, abdomen, or pelvis. 4. No acute fracture or traumatic malalignment of the thoracic or lumbar spine. Electronically Signed   By: Tish Frederickson M.D.   On: 07/13/2021 21:10   CT CHEST ABDOMEN PELVIS W CONTRAST  Result Date: 07/13/2021 CLINICAL DATA:  Thrown off horse.  Low back pain.  Did see stars. EXAM: CT HEAD WITHOUT CONTRAST CT CERVICAL SPINE WITHOUT CONTRAST CT CHEST, ABDOMEN AND PELVIS WITH CONTRAST TECHNIQUE: Contiguous axial images were obtained from the base of the skull through the vertex without intravenous contrast. Multidetector CT imaging of the cervical spine was performed without intravenous contrast. Multiplanar CT image reconstructions were also generated. Multidetector CT imaging of the chest, abdomen and pelvis was performed following the standard protocol during bolus administration of intravenous contrast. CONTRAST:  30mL OMNIPAQUE IOHEXOL 350 MG/ML SOLN COMPARISON:  None. FINDINGS: CT HEAD FINDINGS Brain: No evidence of large-territorial acute infarction. No parenchymal hemorrhage. No mass lesion. No extra-axial collection. No mass effect or midline shift. No hydrocephalus. Basilar cisterns are patent. Vascular: No hyperdense vessel. Skull: No acute fracture or focal lesion. Sinuses/Orbits: Paranasal sinuses and mastoid air cells are clear. The orbits are unremarkable. Other: None. CT CERVICAL FINDINGS Alignment: Straightening of the normal cervical lordosis likely due to positioning. Skull base and vertebrae: No acute fracture. No aggressive appearing focal osseous  lesion or focal pathologic process. Soft tissues and spinal canal: No prevertebral fluid or swelling. No visible canal hematoma. Upper chest: Unremarkable. Other: None. CHEST: Ports and Devices: None. Lungs/airways: No focal consolidation. No pulmonary nodule. No pulmonary mass. No pulmonary contusion or laceration. No pneumatocele formation. The central airways are patent. Pleura: No pleural effusion. No pneumothorax. No hemothorax. Lymph Nodes: No mediastinal, hilar, or axillary lymphadenopathy. Mediastinum: Vague soft tissue density within the  anterior mediastinum with associated vasculature likely residual thymus. No pneumomediastinum. No aortic injury or mediastinal hematoma. The thoracic aorta is normal in caliber. The heart is normal in size. No significant pericardial effusion. The esophagus is unremarkable. Asymmetrically larger right thyroid gland compared to the left. Otherwise the thyroid is unremarkable. Chest Wall / Breasts: No chest wall mass.  Bilateral gynecomastia. Musculoskeletal: No acute rib or sternal fracture. No spinal fracture. Mild lower thoracic spine degenerative changes. ABDOMEN / PELVIS: Liver: Not enlarged. No focal lesion. No laceration or subcapsular hematoma. Biliary System: The gallbladder is otherwise unremarkable with no radio-opaque gallstones. No biliary ductal dilatation. Pancreas: Normal pancreatic contour. No main pancreatic duct dilatation. Spleen: Not enlarged. No focal lesion. No laceration, subcapsular hematoma, or vascular injury. A splenule is noted. Adrenal Glands: No nodularity bilaterally. Kidneys: Bilateral kidneys enhance symmetrically. No hydronephrosis. No contusion, laceration, or subcapsular hematoma. No injury to the vascular structures or collecting systems. No hydroureter. The urinary bladder is unremarkable. Bowel: No small or large bowel wall thickening or dilatation. The appendix is unremarkable. Mesentery, Omentum, and Peritoneum: No simple free fluid  ascites. No pneumoperitoneum. No hemoperitoneum. No mesenteric hematoma identified. No organized fluid collection. Pelvic Organs: Normal. Lymph Nodes: No abdominal, pelvic, inguinal lymphadenopathy. Vasculature: No abdominal aorta or iliac aneurysm. No active contrast extravasation or pseudoaneurysm. Musculoskeletal: No significant soft tissue hematoma. No acute pelvic fracture. No spinal fracture. IMPRESSION: 1. No acute intracranial abnormality. 2. No acute displaced fracture or traumatic listhesis of the cervical spine. 3.  No acute traumatic injury to the chest, abdomen, or pelvis. 4. No acute fracture or traumatic malalignment of the thoracic or lumbar spine. Electronically Signed   By: Tish Frederickson M.D.   On: 07/13/2021 21:10       Assessment & Plan:    Active Problems:   Fall   Fall from horse Low back pain Scans have been negative for traumatic Neurosurgery recommends MRI in the a.m. if patient is still having pain Decadron started Continue pain control with morphine Continue to monitor Low back pain with shooting pain in the right lower extremity Started after injury to back from falling from horse See plan above    DVT Prophylaxis-   Heparin- SCDs   AM Labs Ordered, also please review Full Orders  Family Communication: Admission, patients condition and plan of care including tests being ordered have been discussed with the patient and wife who indicate understanding and agree with the plan and Code Status.  Code Status: Full  Admission status: ObservationTime spent in minutes : 65   Tiler Brandis B Zierle-Ghosh DO

## 2024-06-13 ENCOUNTER — Emergency Department (HOSPITAL_COMMUNITY)

## 2024-06-13 ENCOUNTER — Encounter (HOSPITAL_COMMUNITY): Payer: Self-pay

## 2024-06-13 ENCOUNTER — Other Ambulatory Visit: Payer: Self-pay

## 2024-06-13 ENCOUNTER — Observation Stay (HOSPITAL_COMMUNITY)
Admission: EM | Admit: 2024-06-13 | Discharge: 2024-06-14 | Disposition: A | Discharge status: Home / Self Care | Attending: Emergency Medicine | Admitting: Emergency Medicine

## 2024-06-13 DIAGNOSIS — R519 Headache, unspecified: Secondary | ICD-10-CM | POA: Diagnosis present

## 2024-06-13 DIAGNOSIS — R42 Dizziness and giddiness: Secondary | ICD-10-CM | POA: Diagnosis not present

## 2024-06-13 DIAGNOSIS — W19XXXD Unspecified fall, subsequent encounter: Secondary | ICD-10-CM

## 2024-06-13 LAB — COMPREHENSIVE METABOLIC PANEL WITH GFR
ALT: 19 U/L (ref 0–44)
AST: 18 U/L (ref 15–41)
Albumin: 4 g/dL (ref 3.5–5.0)
Alkaline Phosphatase: 73 U/L (ref 38–126)
Anion gap: 12 (ref 5–15)
BUN: 22 mg/dL — ABNORMAL HIGH (ref 6–20)
CO2: 22 mmol/L (ref 22–32)
Calcium: 9.2 mg/dL (ref 8.9–10.3)
Chloride: 105 mmol/L (ref 98–111)
Creatinine, Ser: 1.17 mg/dL (ref 0.61–1.24)
GFR, Estimated: >60 mL/min (ref >60)
Glucose, Bld: 107 mg/dL — ABNORMAL HIGH (ref 70–99)
Potassium: 4 mmol/L (ref 3.5–5.1)
Sodium: 139 mmol/L (ref 135–145)
Total Bilirubin: 0.7 mg/dL (ref 0.0–1.2)
Total Protein: 7.1 g/dL (ref 6.5–8.1)

## 2024-06-13 LAB — CBC
HCT: 42.6 % (ref 39.0–52.0)
Hemoglobin: 14.2 g/dL (ref 13.0–17.0)
MCH: 27.8 pg (ref 26.0–34.0)
MCHC: 33.3 g/dL (ref 30.0–36.0)
MCV: 83.4 fL (ref 80.0–100.0)
Platelets: 226 K/uL (ref 150–400)
RBC: 5.11 MIL/uL (ref 4.22–5.81)
RDW: 13.5 % (ref 11.5–15.5)
WBC: 9.3 K/uL (ref 4.0–10.5)
nRBC: 0 % (ref 0.0–0.2)

## 2024-06-13 LAB — DIFFERENTIAL
Abs Immature Granulocytes: 0.03 K/uL (ref 0.00–0.07)
Basophils Absolute: 0.1 K/uL (ref 0.0–0.1)
Basophils Relative: 1 %
Eosinophils Absolute: 0.1 K/uL (ref 0.0–0.5)
Eosinophils Relative: 1 %
Immature Granulocytes: 0 %
Lymphocytes Relative: 9 %
Lymphs Abs: 0.8 K/uL (ref 0.7–4.0)
Monocytes Absolute: 0.7 K/uL (ref 0.1–1.0)
Monocytes Relative: 8 %
Neutro Abs: 7.6 K/uL (ref 1.7–7.7)
Neutrophils Relative %: 81 %

## 2024-06-13 LAB — TROPONIN I (HIGH SENSITIVITY): Troponin I (High Sensitivity): 3 ng/L (ref <18)

## 2024-06-13 LAB — APTT: aPTT: 29 s (ref 24–36)

## 2024-06-13 LAB — PROTIME-INR
INR: 1.1 (ref 0.8–1.2)
Prothrombin Time: 15 s (ref 11.4–15.2)

## 2024-06-13 LAB — ETHANOL: Alcohol, Ethyl (B): <15 mg/dL (ref <15)

## 2024-06-13 MED ORDER — MECLIZINE HCL 12.5 MG PO TABS
25.0000 mg | ORAL_TABLET | Freq: Once | ORAL | Status: AC
  Administered 2024-06-13: 25 mg via ORAL
  Filled 2024-06-13: qty 2

## 2024-06-13 MED ORDER — SODIUM CHLORIDE 0.9 % IV BOLUS
1000.0000 mL | Freq: Once | INTRAVENOUS | Status: AC
  Administered 2024-06-13: 1000 mL via INTRAVENOUS

## 2024-06-13 MED ORDER — IOHEXOL 350 MG/ML SOLN
75.0000 mL | Freq: Once | INTRAVENOUS | Status: AC | PRN
  Administered 2024-06-13: 75 mL via INTRAVENOUS

## 2024-06-13 MED ORDER — ACETAMINOPHEN 325 MG PO TABS
650.0000 mg | ORAL_TABLET | Freq: Once | ORAL | Status: AC
  Administered 2024-06-13: 650 mg via ORAL
  Filled 2024-06-13: qty 2

## 2024-06-13 NOTE — ED Triage Notes (Signed)
 Patient from home for Code Stroke. Patient LKN was 61. Reports dizziness, slurred speech, and chest pain. Also reports headache that started at 1700. Patient reports he thought he may have been exposed to something at work. Upon arrival to ER, patient is alert and oriented; weakness noted to bilateral arms. Stroke alert initiated 2215

## 2024-06-13 NOTE — ED Notes (Signed)
 Patient transported to CT

## 2024-06-13 NOTE — ED Provider Notes (Signed)
 [place] EMERGENCY DEPARTMENT AT [facility] Provider Note   CSN: [id number] Arrival date & time: 06/13/24  2216  An emergency department physician performed an initial assessment on this suspected stroke patient at 2219.  Patient presents with: Code Stroke   Cody Yu is a 37 y.o. male.   HPI     He presents because of concern for code stroke in the field.  According the patient, he started with a headache driving home.  This happened about 5 to 6 PM.  Around the same time, he started feeling vertiginous..  Felt like his head was swimming.  Wife felt like maybe he was talking a little bit differently around 8 PM.  Patient endorses generalized weakness.  Patient states he just, feels weak all over.  No focal weakness I can appreciate.  He endorses just a room spinning affect whenever he moves his head or looks around.  No vision field cuts.  No diplopia.  No sensory changes in the face or upper or lower extremities.  He just feels generalized weak.  Endorses a generalized headache as well.  No fever no chills.  No neck pain.  No pain with flexion of the neck.  The episode of chest pain earlier but currently not experience any can of chest pain.  No shortness of breath.  No cough.  No fever no chills.  No abdominal pain.  He does state that he had episode of diarrhea on the way home today.  He states that he is aware that he had to go to the bathroom.  No back pain.  No falls.  Denies rash.  [place]  He was removed a few ticks.  He does not remember any ticks being big.  No rash following the tick bite.    Prior to Admission medications   Medication Sig Start Date End Date Taking? Authorizing Provider  Cholecalciferol (VITAMIN D3) 1.25 MG (50000 UT) CAPS Take 1 capsule by mouth.    Caswell Ronnell BROCKS, MD  cyclobenzaprine  (FLEXERIL ) 5 MG tablet Take 1 tablet (5 mg total) by mouth 3 (three) times daily as needed for muscle spasms. 07/14/21    Caleen Burgess BROCKS, MD    Allergies: Celebrex [celecoxib]    Review of Systems  Constitutional:  Negative for chills and fever.  HENT:  Negative for ear pain and sore throat.   Eyes:  Negative for pain and visual disturbance.  Respiratory:  Negative for cough and shortness of breath.   Cardiovascular:  Negative for chest pain and palpitations.  Gastrointestinal:  Negative for abdominal pain and vomiting.  Genitourinary:  Negative for dysuria and hematuria.  Musculoskeletal:  Negative for arthralgias and back pain.  Skin:  Negative for color change and rash.  Neurological:  Negative for seizures and syncope.  All other systems reviewed and are negative.   Updated Vital Signs BP (!) 151/86 (BP Location: Left Arm)   Pulse 100   Temp 100.1 F (37.8 C) (Oral)   Resp (!) 21   Ht 6' 1 (1.854 m)   Wt 113.4 kg   SpO2 96%   BMI 32.98 kg/m   Physical Exam Vitals and nursing note reviewed.  Constitutional:      General: He is not in acute distress.    Appearance: He is well-developed.  HENT:     Head: Normocephalic and atraumatic.  Eyes:     General: No visual field deficit.    Conjunctiva/sclera:  Conjunctivae normal.  Cardiovascular:     Rate and Rhythm: Normal rate and regular rhythm.     Heart sounds: No murmur heard. Pulmonary:     Effort: Pulmonary effort is normal. No respiratory distress.     Breath sounds: Normal breath sounds.  Abdominal:     Palpations: Abdomen is soft.     Tenderness: There is no abdominal tenderness.  Musculoskeletal:        General: No swelling.     Cervical back: Neck supple.  Skin:    General: Skin is warm and dry.     Capillary Refill: Capillary refill takes less than 2 seconds.  Neurological:     Mental Status: He is alert and oriented to person, place, and time. Mental status is at baseline.     GCS: GCS eye subscore is 4. GCS verbal subscore is 5. GCS motor subscore is 6.     Cranial Nerves: Cranial nerves 2-12 are intact. No cranial  nerve deficit, dysarthria or facial asymmetry.     Sensory: Sensation is intact. No sensory deficit.     Motor: Motor function is intact. No weakness or pronator drift.     Coordination: Coordination is intact. Finger-Nose-Finger Test normal.  Psychiatric:        Mood and Affect: Mood normal.     (all labs ordered are listed, but only abnormal results are displayed) Labs Reviewed  COMPREHENSIVE METABOLIC PANEL WITH GFR - Abnormal; Notable for the following components:      Result Value   Glucose, Bld 107 (*)    BUN 22 (*)    All other components within normal limits  RESP PANEL BY RT-PCR (RSV, FLU A&B, COVID)  RVPGX2  ETHANOL  PROTIME-INR  APTT  DIFFERENTIAL  CBC  RAPID URINE DRUG SCREEN, HOSP PERFORMED  LYME DISEASE SEROLOGY W/REFLEX  TROPONIN I (HIGH SENSITIVITY)    EKG: None  Radiology: CT HEAD CODE STROKE WO CONTRAST Result Date: 06/13/2024 CLINICAL DATA:  Code stroke.  Neuro deficit, acute, stroke suspected EXAM: CT HEAD WITHOUT CONTRAST TECHNIQUE: Contiguous axial images were obtained from the base of the skull through the vertex without intravenous contrast. RADIATION DOSE REDUCTION: This exam was performed according to the departmental dose-optimization program which includes automated exposure control, adjustment of the mA and/or kV according to patient size and/or use of iterative reconstruction technique. COMPARISON:  CT head 07/13/2021. FINDINGS: Brain: No evidence of acute infarction, hemorrhage, hydrocephalus, extra-axial collection or mass lesion/mass effect. Vascular: No hyperdense vessel. Skull: No acute fracture. Sinuses/Orbits: No acute finding. Other: No mastoid effusions. ASPECTS [facility] Stroke Program Early CT Score) Total score (0-10 with 10 being normal): 10. IMPRESSION: No evidence of acute intracranial abnormality. Aspects is 10. Code stroke imaging results were communicated on 06/13/2024 at 10:40 pm to provider Dr. Lavonia Via telephone. Electronically Signed    By: Gilmore GORMAN Molt M.D.   On: 06/13/2024 22:40     Procedures   Medications Ordered in the ED  sodium chloride  0.9 % bolus 1,000 mL (has no administration in time range)  iohexol  (OMNIPAQUE ) 350 MG/ML injection 75 mL (75 mLs Intravenous Contrast Given 06/13/24 2318)    Clinical Course as of 06/13/24 2328  Tue Jun 13, 2024  2254 Neuro telespeacilist: Non specific. Both sounds. Not a candidate for thrombolytics based off feeling dizzy around 5.   Recommend CTA head/neck routine study.   Brain MRI in the morning if nothing seen on CTA head/neck.  [TL]    Clinical Course User Index [  TL] Simon Lavonia SAILOR, MD                   NIH Stroke Scale: 4              Medical Decision Making Amount and/or Complexity of Data Reviewed Labs: ordered. Radiology: ordered.  Risk Prescription drug management.   He presents because of concern for code stroke in the field.  According the patient, he started with a headache driving home.  This happened about 5 to 6 PM.  Around the same time, he started feeling vertiginous..  Felt like his head was swimming.  Wife felt like maybe he was talking a little bit differently around 8 PM.  Patient endorses generalized weakness.  Patient states he just, feels weak all over.  No focal weakness I can appreciate.  He endorses just a room spinning affect whenever he moves his head or looks around.  No vision field cuts.  No diplopia.  No sensory changes in the face or upper or lower extremities.  He just feels generalized weak.  Endorses a generalized headache as well.  No fever no chills.  No neck pain.  No pain with flexion of the neck.  The episode of chest pain earlier but currently not experience any can of chest pain.  No shortness of breath.  No cough.  No fever no chills.  No abdominal pain.  He does state that he had episode of diarrhea on the way home today.  He states that he is aware that he had to go to the bathroom.  No back pain.  No falls.  Denies  rash.  Patient states [place].  He was removed a few ticks.  He does not remember any ticks being big.  No rash following the tick bite.   Upon exam, NIH is 4 given some very minimal bilateral droop in bilateral arms and legs.  This seems to be more generalized weakness as opposed to focal deficit..  Nonfocal exam.  He just endorses generalized weakness.  Some fragmented speech but no dysarthria or aphasia.  .  Normal finger-nose in bilateral extremities.  A and O x 3.  He does have some horizontal nystagmus.  Test of skew normal.  No vertical or rotary nystagmus.    Telestroke workup.  They do not think he is a candidate for TNK.  Symptoms started around 5 PM.  Recommended CTA head and neck without perfusion.  If this is negative, they would recommend MRI brain in the morning.  Does not need be transferred for MRI brain at this time.   Currently, patient has no pain with neck flexion.  Negative Kernig's and Brudzinski's.  No Current concerns for meningitis or encephalitis.  No indication for emergent LP at this time.  No neck or back trauma this point time.  No current chest pain.  2+ radial pulses bilaterally as well as 2+ dorsal pedal and posterior tibial pulses bilaterally.  No concerns for dissection process at this time.   Signed out in stable condition pending CTA head/neck.    CRITICAL CARE Performed by: Lavonia SAILOR Simon   Total critical care time: 45 minutes  Critical care time was exclusive of separately billable procedures and treating other patients.  Critical care was necessary to treat or prevent imminent or life-threatening deterioration.  Critical care was time spent personally by me on the following activities: development of treatment plan with patient and/or surrogate as well  as nursing, discussions with consultants, evaluation of patient's response to treatment, examination of patient, obtaining history from patient or surrogate, ordering and performing  treatments and interventions, ordering and review of laboratory studies, ordering and review of radiographic studies, pulse oximetry and re-evaluation of patient's condition.       Final diagnoses:  None    ED Discharge Orders     None          Simon Lavonia SAILOR, MD 06/13/24 2328

## 2024-06-13 NOTE — ED Provider Notes (Signed)
 Care of patient assumed from Dr. Simon.  This patient had onset of dizziness and mild speech difficulty earlier this evening.  He arrives as a code stroke.  He is awaiting CTA.  If negative, he can be admitted at [facility] for further stroke workup. Physical Exam  BP (!) 151/86 (BP Location: Left Arm)   Pulse 100   Temp 100.1 F (37.8 C) (Oral)   Resp (!) 21   Ht 6' 1 (1.854 m)   Wt 113.4 kg   SpO2 96%   BMI 32.98 kg/m   Physical Exam Vitals and nursing note reviewed.  Constitutional:      General: He is not in acute distress.    Appearance: Normal appearance. He is well-developed. He is not ill-appearing, toxic-appearing or diaphoretic.  HENT:     Head: Normocephalic and atraumatic.     Right Ear: External ear normal.     Left Ear: External ear normal.     Nose: Nose normal.  Eyes:     Extraocular Movements: Extraocular movements intact.     Conjunctiva/sclera: Conjunctivae normal.  Cardiovascular:     Rate and Rhythm: Normal rate and regular rhythm.  Pulmonary:     Effort: Pulmonary effort is normal. No respiratory distress.  Abdominal:     General: There is no distension.     Palpations: Abdomen is soft.  Musculoskeletal:        General: No swelling.     Cervical back: Normal range of motion and neck supple.  Skin:    General: Skin is warm and dry.     Capillary Refill: Capillary refill takes less than 2 seconds.     Coloration: Skin is not jaundiced or pale.  Neurological:     General: No focal deficit present.     Mental Status: He is alert and oriented to person, place, and time.  Psychiatric:        Mood and Affect: Mood normal.        Behavior: Behavior normal.     Procedures  Procedures  ED Course / MDM   Clinical Course as of 06/13/24 2343  Tue Jun 13, 2024  2254 Neuro telespeacilist: Non specific. Both sounds. Not a candidate for thrombolytics based off feeling dizzy around 5.   Recommend CTA head/neck routine study.   Brain MRI in the morning  if nothing seen on CTA head/neck.  [TL]    Clinical Course User Index [TL] Simon Lavonia SAILOR, MD   Medical Decision Making Amount and/or Complexity of Data Reviewed Labs: ordered. Radiology: ordered.  Risk OTC drugs. Prescription drug management. Decision regarding hospitalization.   CTA did not show any acute findings.  He was given meclizine .  On assessment, patient symptoms have improved.  When he stands up, he continues to have headache and dizziness.  Patient was admitted for further management.       Melvenia Motto, MD 06/14/24 [phone]

## 2024-06-13 NOTE — Consult Note (Signed)
 [facility]   Patient Name:   Marx, Doig Date of Birth:   Apr 23, 1986 Identification Number:   MRN - [id number] Date of Service:   06/13/2024 22:17:25  Diagnosis:       R42 - Dizziness/ Vertigo/ Giddiness       R51.9 - Headache, unspecified  Impression:      38 year old man with no significant past medical history, who presents with headache, dizziness, followed by generalized weakness, slowed speech. Also with chest pain symptoms generalized and nonspecific to only stroke. No evidence of an aphasia on examination though his speech pattern was slow was able to get all the words out, with normal comprehension. Not a candidate for IV thrombolytic based on his last known well. He has had 2 episodes of dizziness within the past week, but these were transient. May obtain CTA to rule out vertebrobasilar disease. With generalized symptoms also have to consider toxic/metabolic and infectious process. If workup is otherwise unremarkable may consider brain MRI to rule out structural process.  Our recommendations are outlined below.  Recommendations:        Stroke/Telemetry Floor       Neuro Checks       Bedside Swallow Eval       DVT Prophylaxis       IV Fluids, Normal Saline       Head of Bed 30 Degrees       Euglycemia and Avoid Hyperthermia (PRN Acetaminophen )    ------------------------------------------------------------------------------  Metrics: Last Known Well: 06/13/2024 17:00:00 Dispatch Time: 06/13/2024 22:17:25 Arrival Time: 06/13/2024 22:16:00 Initial Response Time: 06/13/2024 22:21:47 Symptoms: Headache, dizziness, generalized weakness, slowed speech.. Initial patient interaction: 06/13/2024 22:29:56 NIHSS Assessment Completed: 06/13/2024 22:38:35 Patient is not a candidate for Thrombolytic. Thrombolytic Medical Decision: 06/13/2024 22:38:37 Patient was not deemed candidate for Thrombolytic because of following  reasons: LKW outside 4.5 hr window. .  CT Head: I personally reviewed all the CT images that were available to me and it showed: No evidence of an acute intracranial process.  Primary Provider Notified of Diagnostic Impression and Management Plan on: 06/13/2024 23:02:58    ------------------------------------------------------------------------------  History of Present Illness: Patient is a 38 year old Male.  Patient was brought by EMS for symptoms of Headache, dizziness, generalized weakness, slowed speech.SABRA He presents with acute onset headache starting at about 5 PM as well as dizziness characterized as room spinning sensation. Worse with head movement. Symptoms progressed to the day and was more severe at about 730. Went home at about 830 and noted generalized weakness difficulty ambulating as well as chest pain. Had similar headache and dizziness occur twice within the past week. Lasted for less than 30 minutes both times. No history of migraines. No known medical conditions.    Past Medical History:      There is no history of Stroke      There is no history of Migraine Headaches  Medications:  No Anticoagulant use  No Antiplatelet use Reviewed EMR for current medications  Allergies:  Reviewed Description: Celebrex  Social History: Smoking: No Alcohol Use: Yes Drug Use: No  Family History:  There Is Family History Nq:Dumnxz in his paternal grandfather, first at age 62 There is no family history of premature cerebrovascular disease pertinent to this consultation  ROS : 14 Points Review of Systems was performed and was negative except mentioned in HPI.  Past Surgical History: There Is No Surgical History Contributory To Today's Visit     Examination: BP(145/90), Pulse(95), 1A:  Level of Consciousness - Alert; keenly responsive + 0 1B: Ask Month and Age - Both Questions Right + 0 1C: Blink Eyes & Squeeze Hands - Performs Both Tasks + 0 2: Test Horizontal  Extraocular Movements - Normal + 0 3: Test Visual Fields - No Visual Loss + 0 4: Test Facial Palsy (Use Grimace if Obtunded) - Normal symmetry + 0 5A: Test Left Arm Motor Drift - Drift, but doesn't hit bed + 1 5B: Test Right Arm Motor Drift - Drift, but doesn't hit bed + 1 6A: Test Left Leg Motor Drift - Drift, but doesn't hit bed + 1 6B: Test Right Leg Motor Drift - Drift, but doesn't hit bed + 1 7: Test Limb Ataxia (FNF/Heel-Shin) - No Ataxia + 0 8: Test Sensation - Normal; No sensory loss + 0 9: Test Language/Aphasia - Normal; No aphasia + 0 10: Test Dysarthria - Normal + 0 11: Test Extinction/Inattention - No abnormality + 0  NIHSS Score: 4  NIHSS Free Text : Slow speech but able to complete all the language assessment.  Pre-Morbid Modified Rankin Scale: 0 Points = No symptoms at all  Spoke with : Dr. Lavonia  This consult was conducted in real time using interactive audio and Immunologist. Patient was informed of the technology being used for this visit and agreed to proceed. Patient located in hospital and provider located at home/office setting.   Patient is being evaluated for possible acute neurologic impairment and high probability of imminent or life-threatening deterioration. I spent total of 35 minutes providing care to this patient, including time for face to face visit via telemedicine, review of medical records, imaging studies and discussion of findings with providers, the patient and/or family.    Dr Claudette Dates   [facility] For Inpatient follow-up with [facility] physician please call [facility] at [phone]. As we are not an outpatient service for any post hospital discharge needs please contact the hospital for assistance. If you have any questions for the [facility] physicians or need to reconsult for clinical or diagnostic changes please contact us  via [facility] at [phone].   Signature : Claudette Dates

## 2024-06-13 NOTE — ED Notes (Signed)
 Code stroke alert button initially triggered at 2204 due to EMS calling stroke alert in the field. Advised by elink to repage on patient arrival.

## 2024-06-14 ENCOUNTER — Encounter (HOSPITAL_COMMUNITY): Payer: Self-pay | Admitting: Family Medicine

## 2024-06-14 ENCOUNTER — Observation Stay (HOSPITAL_COMMUNITY)

## 2024-06-14 DIAGNOSIS — R42 Dizziness and giddiness: Secondary | ICD-10-CM

## 2024-06-14 LAB — CBC
HCT: 38.9 % — ABNORMAL LOW (ref 39.0–52.0)
Hemoglobin: 12.6 g/dL — ABNORMAL LOW (ref 13.0–17.0)
MCH: 27.5 pg (ref 26.0–34.0)
MCHC: 32.4 g/dL (ref 30.0–36.0)
MCV: 84.9 fL (ref 80.0–100.0)
Platelets: 188 K/uL (ref 150–400)
RBC: 4.58 MIL/uL (ref 4.22–5.81)
RDW: 13.6 % (ref 11.5–15.5)
WBC: 7.4 K/uL (ref 4.0–10.5)
nRBC: 0 % (ref 0.0–0.2)

## 2024-06-14 LAB — RESP PANEL BY RT-PCR (RSV, FLU A&B, COVID)  RVPGX2
Influenza A by PCR: NEGATIVE
Influenza B by PCR: NEGATIVE
Resp Syncytial Virus by PCR: NEGATIVE
SARS Coronavirus 2 by RT PCR: NEGATIVE

## 2024-06-14 LAB — URINALYSIS, ROUTINE W REFLEX MICROSCOPIC
Bilirubin Urine: NEGATIVE
Glucose, UA: NEGATIVE mg/dL
Hgb urine dipstick: NEGATIVE
Ketones, ur: NEGATIVE mg/dL
Leukocytes,Ua: NEGATIVE
Nitrite: NEGATIVE
Protein, ur: NEGATIVE mg/dL
Specific Gravity, Urine: 1.035 — ABNORMAL HIGH (ref 1.005–1.030)
pH: 6 (ref 5.0–8.0)

## 2024-06-14 LAB — BASIC METABOLIC PANEL WITH GFR
Anion gap: 8 (ref 5–15)
BUN: 20 mg/dL (ref 6–20)
CO2: 21 mmol/L — ABNORMAL LOW (ref 22–32)
Calcium: 8.2 mg/dL — ABNORMAL LOW (ref 8.9–10.3)
Chloride: 107 mmol/L (ref 98–111)
Creatinine, Ser: 1.03 mg/dL (ref 0.61–1.24)
GFR, Estimated: >60 mL/min (ref >60)
Glucose, Bld: 91 mg/dL (ref 70–99)
Potassium: 3.8 mmol/L (ref 3.5–5.1)
Sodium: 136 mmol/L (ref 135–145)

## 2024-06-14 LAB — HIV ANTIBODY (ROUTINE TESTING W REFLEX): HIV Screen 4th Generation wRfx: NONREACTIVE

## 2024-06-14 LAB — BLOOD GAS, VENOUS
Acid-Base Excess: 1.3 mmol/L (ref 0.0–2.0)
Bicarbonate: 25 mmol/L (ref 20.0–28.0)
Drawn by: 1528
O2 Saturation: 94.8 %
Patient temperature: 36.9
pCO2, Ven: 36 mmHg — ABNORMAL LOW (ref 44–60)
pH, Ven: 7.45 — ABNORMAL HIGH (ref 7.25–7.43)
pO2, Ven: 63 mmHg — ABNORMAL HIGH (ref 32–45)

## 2024-06-14 LAB — TSH: TSH: 1.088 u[IU]/mL (ref 0.350–4.500)

## 2024-06-14 LAB — AMMONIA: Ammonia: 38 umol/L — ABNORMAL HIGH (ref 9–35)

## 2024-06-14 LAB — MAGNESIUM: Magnesium: 1.9 mg/dL (ref 1.7–2.4)

## 2024-06-14 LAB — CBG MONITORING, ED: Glucose-Capillary: 97 mg/dL (ref 70–99)

## 2024-06-14 LAB — RAPID URINE DRUG SCREEN, HOSP PERFORMED
Amphetamines: NOT DETECTED
Barbiturates: NOT DETECTED
Benzodiazepines: NOT DETECTED
Cocaine: NOT DETECTED
Opiates: NOT DETECTED
Tetrahydrocannabinol: NOT DETECTED

## 2024-06-14 LAB — VITAMIN B12: Vitamin B-12: 251 pg/mL (ref 180–914)

## 2024-06-14 LAB — RPR: RPR Ser Ql: NONREACTIVE

## 2024-06-14 MED ORDER — MECLIZINE HCL 12.5 MG PO TABS: 25.0000 mg | ORAL_TABLET | Freq: Three times a day (TID) | ORAL | Status: DC | PRN

## 2024-06-14 MED ORDER — ENOXAPARIN SODIUM 40 MG/0.4ML IJ SOSY
40.0000 mg | PREFILLED_SYRINGE | INTRAMUSCULAR | Status: DC
  Filled 2024-06-14: qty 0.4

## 2024-06-14 MED ORDER — SENNOSIDES-DOCUSATE SODIUM 8.6-50 MG PO TABS: 1.0000 | ORAL_TABLET | Freq: Every evening | ORAL | Status: DC | PRN

## 2024-06-14 MED ORDER — GUAIFENESIN 100 MG/5ML PO LIQD: 5.0000 mL | ORAL | Status: DC | PRN

## 2024-06-14 MED ORDER — SODIUM CHLORIDE 0.9 % IV SOLN: INTRAVENOUS | Status: DC

## 2024-06-14 MED ORDER — ONDANSETRON HCL 4 MG/2ML IJ SOLN: 4.0000 mg | Freq: Four times a day (QID) | INTRAMUSCULAR | Status: DC | PRN

## 2024-06-14 MED ORDER — ACETAMINOPHEN 650 MG RE SUPP: 650.0000 mg | RECTAL | Status: DC | PRN

## 2024-06-14 MED ORDER — ACETAMINOPHEN 160 MG/5ML PO SOLN: 650.0000 mg | ORAL | Status: DC | PRN

## 2024-06-14 MED ORDER — ACETAMINOPHEN 325 MG PO TABS: 650.0000 mg | ORAL_TABLET | ORAL | Status: DC | PRN

## 2024-06-14 NOTE — ED Notes (Signed)
 Portable x-ray at bedside

## 2024-06-14 NOTE — Discharge Instructions (Signed)

## 2024-06-14 NOTE — Evaluation (Signed)
 Physical Therapy Evaluation Patient Details Name: Cody Yu MRN: [id number] DOB: 1986-07-13 Today's Date: 06/14/2024  History of Present Illness  Cody Yu is a generally healthy 38 y.o. male who presents with room spinning sensation, headache, chills, and generalized weakness.     Patient was in his usual state until this evening when he developed a headache and room spinning sensation with chills and generalized weakness.  Symptoms are somewhat better when he remains still, but worsen when he moves his head or tries to stand up.  His wife was concerned that his speech was slurred earlier.  He has had a mild nonproductive cough today.   Clinical Impression  Patient agreeable to PT evaluation. Patient was received supine in bed. Patient was independent with all bed mobility, functional transfers, and ambulation without AD. Patient reports this as baseline. Patient demonstrates good LE strength but is limited with AROM on LUE due to previous injury. Otherwise shoulder flexion strength good. Patient reports since being given medication last night he has not had any of his symptoms that brought him to the hospital, even with head movements and transitional movements. VBI and Trenda Craze testing does not provoke any symptoms. Patient does not present with urgent need for skilled physical therapy at this time. Outpatient PT recommendation given for more detailed vestibular assessment if symptoms were to return. Patient discharged to care of nursing for ambulation daily as tolerated for length of stay.        If plan is discharge home, recommend the following:     Can travel by private vehicle        Equipment Recommendations None recommended by PT  Recommendations for Other Services       Functional Status Assessment Patient has had a recent decline in their functional status and demonstrates the ability to make significant improvements in function in a reasonable and predictable  amount of time.     Precautions / Restrictions Precautions Precautions: None Recall of Precautions/Restrictions: Intact Restrictions Weight Bearing Restrictions Per Provider Order: No      Mobility  Bed Mobility Overal bed mobility: Independent    Transfers Overall transfer level: Independent Equipment used: None        Ambulation/Gait Ambulation/Gait assistance: Independent Gait Distance (Feet): 100 Feet Assistive device: None Gait Pattern/deviations: WFL(Within Functional Limits) Gait velocity: WNL     General Gait Details: No overt LOB or unsteadiness during gait trial. Pt reports feeling at his baseline.  Stairs            Wheelchair Mobility     Tilt Bed    Modified Rankin (Stroke Patients Only)       Balance Overall balance assessment: Independent           Pertinent Vitals/Pain Pain Assessment Pain Assessment: No/denies pain    Home Living Family/patient expects to be discharged to:: Private residence Living Arrangements: Children;Spouse/significant other Available Help at Discharge: Family;Available PRN/intermittently Type of Home: House Home Access: Stairs to enter Entrance Stairs-Rails: Can reach both Entrance Stairs-Number of Steps: 8 in front 12-15 in back   Home Layout: Laundry or work area in basement;One level Home Equipment: None      Prior Function Prior Level of Function : Independent/Modified Independent;Working/employed;Driving       Mobility Comments: Tourist information centre manager, no AD, driving, work full time ADLs Comments: Independent     Extremity/Trunk Assessment   Upper Extremity Assessment Upper Extremity Assessment: Overall WFL for tasks assessed;LUE deficits/detail LUE Deficits / Details: Pt reports  previous injury where something popped in L shoulder, limits AROM. MMT shoulder flexion 4+/5 LUE: Shoulder pain with ROM    Lower Extremity Assessment Lower Extremity Assessment: Overall WFL for tasks  assessed    Cervical / Trunk Assessment Cervical / Trunk Assessment: Normal  Communication   Communication Communication: No apparent difficulties    Cognition Arousal: Alert Behavior During Therapy: WFL for tasks assessed/performed   PT - Cognitive impairments: No apparent impairments       Following commands: Intact       Cueing Cueing Techniques: Verbal cues, Tactile cues     General Comments      Exercises     Assessment/Plan    PT Assessment All further PT needs can be met in the next venue of care  PT Problem List Decreased range of motion;Decreased strength       PT Treatment Interventions      PT Goals (Current goals can be found in the Care Plan section)  Acute Rehab PT Goals Patient Stated Goal: Return home PT Goal Formulation: With patient Time For Goal Achievement: 06/15/24 Potential to Achieve Goals: Good    Frequency       Co-evaluation               AM-PAC PT 6 Clicks Mobility  Outcome Measure Help needed turning from your back to your side while in a flat bed without using bedrails?: None Help needed moving from lying on your back to sitting on the side of a flat bed without using bedrails?: None Help needed moving to and from a bed to a chair (including a wheelchair)?: None Help needed standing up from a chair using your arms (e.g., wheelchair or bedside chair)?: None Help needed to walk in hospital room?: None Help needed climbing 3-5 steps with a railing? : None 6 Click Score: 24    End of Session   Activity Tolerance: Patient tolerated treatment well Patient left: in bed;with family/visitor present   PT Visit Diagnosis: Other symptoms and signs involving the nervous system (M70.101)    Time: 8866-8853 PT Time Calculation (min) (ACUTE ONLY): 13 min   Charges:   PT Evaluation $PT Eval Low Complexity: 1 Low   PT General Charges $$ ACUTE PT VISIT: 1 Visit        12:41 PM, 06/14/24 Rosaria Settler, PT,  DPT [place] [facility]

## 2024-06-14 NOTE — ED Notes (Signed)
 Patient Alert and oriented to baseline. Stable and ambulatory to baseline. Patient verbalized understanding of the discharge instructions.  Patient belongings were taken by the patient.

## 2024-06-14 NOTE — Hospital Course (Signed)
 DWAN HEMMELGARN is a generally healthy 38 y.o. male who presents with room spinning sensation, headache, chills, and generalized weakness.   Patient was in his usual state until this evening when he developed a headache and room spinning sensation with chills and generalized weakness.  Symptoms are somewhat better when he remains still, but worsen when he moves his head or tries to stand up.  His wife was concerned that his speech was slurred earlier.  He has had a mild nonproductive cough today.   ED Course: Upon arrival to the ED, patient is found to have Tmax of 37.8 C with normal HR and stable BP.  CMP and CBC are largely unremarkable, troponin is normal, and ethanol undetectable.  There are no acute findings on head CT or CTA head and neck.   Teleneurology recommended neurochecks, infectious and toxic/metabolic workup, and MRI brain.  Patient was treated with a liter of NS, meclizine , and acetaminophen .      Vertigo  - No acute findings on head CT or CTA head and neck  - Check MRI brain, continue supportive care, infectious and toxic/metabolic workup with UDS, UA, CXR, viral panel, ammonia, B12, RPR, and VBG

## 2024-06-14 NOTE — ED Notes (Signed)
 Patient transported to MRI

## 2024-06-14 NOTE — Discharge Summary (Signed)
 Physician Discharge Summary   Patient: Cody Yu MRN: [id number] DOB: Nov 09, 1985  Admit date:     06/13/2024  Discharge date: 06/14/24  Discharge Physician: Adriana DELENA Grams   PCP: Patient, No Pcp Per   Recommendations at discharge:   Follow-up with PCP in 1 week Referral to cardiologist is made patient and wife instructed for patient to follow-up with a cardiologist soon as possible.  For further evaluation of possible cardiac monitoring  Discharge Diagnoses: Principal Problem:   Vertigo    Hospital Course:  Cody Yu is a generally healthy 38 y.o. male who presents with room spinning sensation, headache, chills, and generalized weakness.   Patient was in his usual state until this evening when he developed a headache and room spinning sensation with chills and generalized weakness.  Symptoms are somewhat better when he remains still, but worsen when he moves his head or tries to stand up.  His wife was concerned that his speech was slurred earlier.  He has had a mild nonproductive cough today.   ED Course: Upon arrival to the ED, patient is found to have Tmax of 37.8 C with normal HR and stable BP.  CMP and CBC are largely unremarkable, troponin is normal, and ethanol undetectable.  There are no acute findings on head CT or CTA head and neck.   Teleneurology recommended neurochecks, infectious and toxic/metabolic workup, and MRI brain.  Patient was treated with a liter of NS, meclizine , and acetaminophen .      Vertigo -near syncope - Workup including CT, CTA of the head, MRI of the brain all within normal limits -Orthostatic blood pressure negative, patient's blood pressure currently 113/80 -  mildly black bradycardic heart rate 53  -Rest of the workup including ABG, CBC, BMP within normal limits, with exception of mildly elevated ammonia level of 38 - Respiratory panel negative, UA negative - HIV nonreactive - MRI of the brain within normal  limits   Requested for patient to be followed with cardiology as an outpatient for further evaluation and possible Holter monitoring     Consultants: Outpatient referral to cardiology Procedures performed: CT of the head, MRI of the brain-orthostatic BP checks Disposition: Home Diet recommendation:  Discharge Diet Orders (From admission, onward)     Start     Ordered   06/14/24 0000  Diet - low sodium heart healthy        06/14/24 1330           Cardiac diet DISCHARGE MEDICATION: Allergies as of 06/14/2024       Reactions   Celebrex [celecoxib] Nausea And Vomiting        Medication List     STOP taking these medications    cyclobenzaprine  5 MG tablet Commonly known as: FLEXERIL        TAKE these medications    Vitamin D3 1.25 MG (50000 UT) Caps Take 1 capsule by mouth.        Discharge Exam: Filed Weights   06/13/24 2259  Weight: 113.4 kg        General:  AAO x 3,  cooperative, no distress;   HEENT:  Normocephalic, PERRL, otherwise with in Normal limits   Neuro:  CNII-XII intact. , normal motor and sensation, reflexes intact   Lungs:   Clear to auscultation BL, Respirations unlabored,  No wheezes / crackles  Cardio:    S1/S2, RRR, No murmure, No Rubs or Gallops   Abdomen:  Soft, non-tender, bowel sounds active all four quadrants,  no guarding or peritoneal signs.  Muscular  skeletal:  Limited exam -in bed moving all 4 extremities, strength intact in all 4 lower extremities, sensation intact positive pulses in all 4 extremities 2+ pulses,  symmetric, No pitting edema  Skin:  Dry, warm to touch, negative for any Rashes,  Wounds: Please see nursing documentation      Condition at discharge: good  The results of significant diagnostics from this hospitalization (including imaging, microbiology, ancillary and laboratory) are listed below for reference.   Imaging Studies: MR BRAIN WO CONTRAST Result Date: 06/14/2024 EXAM: MRI BRAIN WITHOUT  CONTRAST 06/14/2024 07:14:14 AM TECHNIQUE: Multiplanar multisequence MRI of the head/brain was performed without the administration of intravenous contrast. COMPARISON: CT of the head dated 06/13/2024. CLINICAL HISTORY: Neuro deficit, acute, stroke suspected. FINDINGS: BRAIN AND VENTRICLES: No acute infarct. No intracranial hemorrhage. No mass. No midline shift. No hydrocephalus. The sella is unremarkable. Normal flow voids. Minimal subcortical white matter disease within the right frontal lobe. ORBITS: No acute abnormality. SINUSES AND MASTOIDS: Polypoid mucosal disease within the floor of the right maxillary sinus. BONES AND SOFT TISSUES: Normal marrow signal. No acute soft tissue abnormality. IMPRESSION: 1. No acute intracranial abnormality. 2. Minimal subcortical white matter disease within the right frontal lobe. Electronically signed by: Evalene Coho MD 06/14/2024 07:27 AM EDT RP Workstation: [id number]   DG CHEST PORT 1 VIEW Result Date: 06/14/2024 CLINICAL DATA:  Dizziness EXAM: PORTABLE CHEST 1 VIEW COMPARISON:  None Available. FINDINGS: The heart size and mediastinal contours are within normal limits. Both lungs are clear. No visible pleural effusions or pneumothorax. No acute osseous abnormality. IMPRESSION: No active disease. Electronically Signed   By: Gilmore GORMAN Molt M.D.   On: 06/14/2024 01:04   CT ANGIO HEAD NECK W WO CM Result Date: 06/13/2024 CLINICAL DATA:  eval for large ves occlusion. Posterior. Dizziness. Slurred speech. Headaches. EXAM: CT ANGIOGRAPHY HEAD AND NECK WITH AND WITHOUT CONTRAST TECHNIQUE: Multidetector CT imaging of the head and neck was performed using the standard protocol during bolus administration of intravenous contrast. Multiplanar CT image reconstructions and MIPs were obtained to evaluate the vascular anatomy. Carotid stenosis measurements (when applicable) are obtained utilizing NASCET criteria, using the distal internal carotid diameter as the denominator.  RADIATION DOSE REDUCTION: This exam was performed according to the departmental dose-optimization program which includes automated exposure control, adjustment of the mA and/or kV according to patient size and/or use of iterative reconstruction technique. CONTRAST:  75mL OMNIPAQUE  IOHEXOL  350 MG/ML SOLN COMPARISON:  Same day CT head. FINDINGS: CTA NECK FINDINGS Aortic arch: Great vessel origins are patent without significant stenosis. Right carotid system: No evidence of dissection, stenosis (50% or greater), or occlusion. Left carotid system: No evidence of dissection, stenosis (50% or greater), or occlusion. Vertebral arteries: Codominant. No evidence of dissection, stenosis (50% or greater), or occlusion. Skeleton: Negative. Other neck: No acute abnormality on limited assessment. Upper chest: Lung apices are clear. Review of the MIP images confirms the above findings CTA HEAD FINDINGS Anterior circulation: Bilateral intracranial ICAs, MCAs and ACAs are patent without proximal hemodynamically significant stenosis. Posterior circulation: Bilateral intradural vertebral arteries, basilar artery and bilateral posterior cerebral arteries are patent without proximal without hemodynamically significant stenosis. Venous sinuses: As permitted by contrast timing, patent. Review of the MIP images confirms the above findings IMPRESSION: No emergent large vessel occlusion or proximal hemodynamically significant stenosis. Electronically Signed   By: Gilmore GORMAN Molt M.D.   On: 06/13/2024 23:53   CT HEAD CODE STROKE WO CONTRAST Result  Date: 06/13/2024 CLINICAL DATA:  Code stroke.  Neuro deficit, acute, stroke suspected EXAM: CT HEAD WITHOUT CONTRAST TECHNIQUE: Contiguous axial images were obtained from the base of the skull through the vertex without intravenous contrast. RADIATION DOSE REDUCTION: This exam was performed according to the departmental dose-optimization program which includes automated exposure control,  adjustment of the mA and/or kV according to patient size and/or use of iterative reconstruction technique. COMPARISON:  CT head 07/13/2021. FINDINGS: Brain: No evidence of acute infarction, hemorrhage, hydrocephalus, extra-axial collection or mass lesion/mass effect. Vascular: No hyperdense vessel. Skull: No acute fracture. Sinuses/Orbits: No acute finding. Other: No mastoid effusions. ASPECTS [facility] Stroke Program Early CT Score) Total score (0-10 with 10 being normal): 10. IMPRESSION: No evidence of acute intracranial abnormality. Aspects is 10. Code stroke imaging results were communicated on 06/13/2024 at 10:40 pm to provider Dr. Lavonia Via telephone. Electronically Signed   By: Gilmore GORMAN Molt M.D.   On: 06/13/2024 22:40    Microbiology: Results for orders placed or performed during the hospital encounter of 06/13/24  Resp panel by RT-PCR (RSV, Flu A&B, Covid) Anterior Nasal Swab     Status: None   Collection Time: 06/13/24 11:52 PM   Specimen: Anterior Nasal Swab  Result Value Ref Range Status   SARS Coronavirus 2 by RT PCR NEGATIVE NEGATIVE Final    Comment: (NOTE) SARS-CoV-2 target nucleic acids are NOT DETECTED.  The SARS-CoV-2 RNA is generally detectable in upper respiratory specimens during the acute phase of infection. The lowest concentration of SARS-CoV-2 viral copies this assay can detect is 138 copies/mL. A negative result does not preclude SARS-Cov-2 infection and should not be used as the sole basis for treatment or other patient management decisions. A negative result may occur with  improper specimen collection/handling, submission of specimen other than nasopharyngeal swab, presence of viral mutation(s) within the areas targeted by this assay, and inadequate number of viral copies(<138 copies/mL). A negative result must be combined with clinical observations, patient history, and epidemiological information. The expected result is Negative.  Fact Sheet for Patients:   BloggerCourse.com  Fact Sheet for Healthcare Providers:  SeriousBroker.it  This test is no t yet approved or cleared by the [place]  FDA and  has been authorized for detection and/or diagnosis of SARS-CoV-2 by FDA under an Emergency Use Authorization (EUA). This EUA will remain  in effect (meaning this test can be used) for the duration of the COVID-19 declaration under Section 564(b)(1) of the Act, 21 U.S.C.section 360bbb-3(b)(1), unless the authorization is terminated  or revoked sooner.       Influenza A by PCR NEGATIVE NEGATIVE Final   Influenza B by PCR NEGATIVE NEGATIVE Final    Comment: (NOTE) The Xpert Xpress SARS-CoV-2/FLU/RSV plus assay is intended as an aid in the diagnosis of influenza from Nasopharyngeal swab specimens and should not be used as a sole basis for treatment. Nasal washings and aspirates are unacceptable for Xpert Xpress SARS-CoV-2/FLU/RSV testing.  Fact Sheet for Patients: BloggerCourse.com  Fact Sheet for Healthcare Providers: SeriousBroker.it  This test is not yet approved or cleared by the [place]  FDA and has been authorized for detection and/or diagnosis of SARS-CoV-2 by FDA under an Emergency Use Authorization (EUA). This EUA will remain in effect (meaning this test can be used) for the duration of the COVID-19 declaration under Section 564(b)(1) of the Act, 21 U.S.C. section 360bbb-3(b)(1), unless the authorization is terminated or revoked.     Resp Syncytial Virus by PCR NEGATIVE NEGATIVE Final  Comment: (NOTE) Fact Sheet for Patients: BloggerCourse.com  Fact Sheet for Healthcare Providers: SeriousBroker.it  This test is not yet approved or cleared by the [place]  FDA and has been authorized for detection and/or diagnosis of SARS-CoV-2 by FDA under an Emergency Use  Authorization (EUA). This EUA will remain in effect (meaning this test can be used) for the duration of the COVID-19 declaration under Section 564(b)(1) of the Act, 21 U.S.C. section 360bbb-3(b)(1), unless the authorization is terminated or revoked.  Performed at [facility], [address], [place], [place] [address]     Labs: CBC: Recent Labs  Lab 06/13/24 2222 06/14/24 0630  WBC 9.3 7.4  NEUTROABS 7.6  --   HGB 14.2 12.6*  HCT 42.6 38.9*  MCV 83.4 84.9  PLT 226 188   Basic Metabolic Panel: Recent Labs  Lab 06/13/24 2222 06/14/24 0630  NA 139 136  K 4.0 3.8  CL 105 107  CO2 22 21*  GLUCOSE 107* 91  BUN 22* 20  CREATININE 1.17 1.03  CALCIUM 9.2 8.2*  MG 1.9  --    Liver Function Tests: Recent Labs  Lab 06/13/24 2222  AST 18  ALT 19  ALKPHOS 73  BILITOT 0.7  PROT 7.1  ALBUMIN 4.0   CBG: Recent Labs  Lab 06/13/24 2216  GLUCAP 97    Discharge time spent: greater than 30 minutes.  Signed: Adriana DELENA Grams, MD [facility] 06/14/2024

## 2024-06-14 NOTE — Progress Notes (Signed)
   06/14/24 1028  TOC Brief Assessment  Insurance and Status Reviewed  Patient has primary care physician  (Added PCP List)  Home environment has been reviewed Home  Prior level of function: Independent  Prior/Current Home Services No current home services  Social Drivers of Health Review SDOH reviewed no interventions necessary  Readmission risk has been reviewed Yes  Transition of care needs no transition of care needs at this time   Transition of Care Department [facility]) has reviewed patient and no TOC needs have been identified at this time. We will continue to monitor patient advancement through interdisciplinary progression rounds. If new patient transition needs arise, please place a TOC consult.

## 2024-06-14 NOTE — H&P (Signed)
 History and Physical    Cody Yu:[id number] DOB: December 26, 1985 DOA: 06/13/2024  PCP: Patient, No Pcp Per   Patient coming from: Home   Chief Complaint: Room-spinning sensation, chills, headache, generalized weakness   HPI: Cody Yu is a generally healthy 38 y.o. male who presents with room spinning sensation, headache, chills, and generalized weakness.  Patient was in his usual state until this evening when he developed a headache and room spinning sensation with chills and generalized weakness.  Symptoms are somewhat better when he remains still, but worsen when he moves his head or tries to stand up.  His wife was concerned that his speech was slurred earlier.  He has had a mild nonproductive cough today.  ED Course: Upon arrival to the ED, patient is found to have Tmax of 37.8 C with normal HR and stable BP.  CMP and CBC are largely unremarkable, troponin is normal, and ethanol undetectable.  There are no acute findings on head CT or CTA head and neck.  Teleneurology recommended neurochecks, infectious and toxic/metabolic workup, and MRI brain.  Patient was treated with a liter of NS, meclizine , and acetaminophen .  Review of Systems:  All other systems reviewed and apart from HPI, are negative.  History reviewed. No pertinent past medical history.  Past Surgical History:  Procedure Laterality Date   KNEE SURGERY     2 on each knee    Social History:   reports that he has never smoked. He has never used smokeless tobacco. He reports that he does not drink alcohol and does not use drugs.  Allergies  Allergen Reactions   Celebrex [Celecoxib] Nausea And Vomiting    History reviewed. No pertinent family history.   Prior to Admission medications   Medication Sig Start Date End Date Taking? Authorizing Provider  Cholecalciferol (VITAMIN D3) 1.25 MG (50000 UT) CAPS Take 1 capsule by mouth.    Caswell Ronnell BROCKS, MD  cyclobenzaprine  (FLEXERIL ) 5 MG tablet Take 1  tablet (5 mg total) by mouth 3 (three) times daily as needed for muscle spasms. 07/14/21   Caleen Burgess BROCKS, MD    Physical Exam: Vitals:   06/13/24 2245 06/13/24 2259 06/13/24 2300 06/14/24 0000  BP: (!) 147/80  (!) 151/86 136/74  Pulse: 94  100 88  Resp: (!) 22  (!) 21 18  Temp: 100.1 F (37.8 C)     TempSrc: Oral     SpO2: 96%  96% 95%  Weight:  113.4 kg    Height:  6' 1 (1.854 m)      Constitutional: NAD, calm  Eyes: PERTLA, lids and conjunctivae normal ENMT: Mucous membranes are moist. Posterior pharynx clear of any exudate or lesions.   Neck: supple, no masses  Respiratory: no wheezing, no crackles. No accessory muscle use.  Cardiovascular: S1 & S2 heard, regular rate and rhythm. No extremity edema.   Abdomen: No distension, no tenderness, soft. Bowel sounds active.  Musculoskeletal: no clubbing / cyanosis. No joint deformity upper and lower extremities.   Skin: no significant rashes, lesions, ulcers. Warm, dry, well-perfused. Neurologic: CN 2-12 grossly intact. Sensation to light touch intact. Strength 5/5 in all 4 limbs. Alert and oriented. No nystagmus.  Psychiatric: Pleasant. Cooperative.    Labs and Imaging on Admission: I have personally reviewed following labs and imaging studies  CBC: Recent Labs  Lab 06/13/24 2222  WBC 9.3  NEUTROABS 7.6  HGB 14.2  HCT 42.6  MCV 83.4  PLT 226   Basic Metabolic  Panel: Recent Labs  Lab 06/13/24 2222  NA 139  K 4.0  CL 105  CO2 22  GLUCOSE 107*  BUN 22*  CREATININE 1.17  CALCIUM 9.2  MG 1.9   GFR: Estimated Creatinine Clearance: 111.9 mL/min (by C-G formula based on SCr of 1.17 mg/dL). Liver Function Tests: Recent Labs  Lab 06/13/24 2222  AST 18  ALT 19  ALKPHOS 73  BILITOT 0.7  PROT 7.1  ALBUMIN 4.0   No results for input(s): LIPASE, AMYLASE in the last 168 hours. No results for input(s): AMMONIA in the last 168 hours. Coagulation Profile: Recent Labs  Lab 06/13/24 2222  INR 1.1   Cardiac  Enzymes: No results for input(s): CKTOTAL, CKMB, CKMBINDEX, TROPONINI in the last 168 hours. BNP (last 3 results) No results for input(s): PROBNP in the last 8760 hours. HbA1C: No results for input(s): HGBA1C in the last 72 hours. CBG: No results for input(s): GLUCAP in the last 168 hours. Lipid Profile: No results for input(s): CHOL, HDL, LDLCALC, TRIG, CHOLHDL, LDLDIRECT in the last 72 hours. Thyroid  Function Tests: No results for input(s): TSH, T4TOTAL, FREET4, T3FREE, THYROIDAB in the last 72 hours. Anemia Panel: No results for input(s): VITAMINB12, FOLATE, FERRITIN, TIBC, IRON, RETICCTPCT in the last 72 hours. Urine analysis: No results found for: COLORURINE, APPEARANCEUR, LABSPEC, PHURINE, GLUCOSEU, HGBUR, BILIRUBINUR, KETONESUR, PROTEINUR, UROBILINOGEN, NITRITE, LEUKOCYTESUR Sepsis Labs: @LABRCNTIP (procalcitonin:4,lacticidven:4) ) Recent Results (from the past 240 hours)  Resp panel by RT-PCR (RSV, Flu A&B, Covid) Anterior Nasal Swab     Status: None   Collection Time: 06/13/24 11:52 PM   Specimen: Anterior Nasal Swab  Result Value Ref Range Status   SARS Coronavirus 2 by RT PCR NEGATIVE NEGATIVE Final    Comment: (NOTE) SARS-CoV-2 target nucleic acids are NOT DETECTED.  The SARS-CoV-2 RNA is generally detectable in upper respiratory specimens during the acute phase of infection. The lowest concentration of SARS-CoV-2 viral copies this assay can detect is 138 copies/mL. A negative result does not preclude SARS-Cov-2 infection and should not be used as the sole basis for treatment or other patient management decisions. A negative result may occur with  improper specimen collection/handling, submission of specimen other than nasopharyngeal swab, presence of viral mutation(s) within the areas targeted by this assay, and inadequate number of viral copies(<138 copies/mL). A negative result must be combined  with clinical observations, patient history, and epidemiological information. The expected result is Negative.  Fact Sheet for Patients:  BloggerCourse.com  Fact Sheet for Healthcare Providers:  SeriousBroker.it  This test is no t yet approved or cleared by the [place]  FDA and  has been authorized for detection and/or diagnosis of SARS-CoV-2 by FDA under an Emergency Use Authorization (EUA). This EUA will remain  in effect (meaning this test can be used) for the duration of the COVID-19 declaration under Section 564(b)(1) of the Act, 21 U.S.C.section 360bbb-3(b)(1), unless the authorization is terminated  or revoked sooner.       Influenza A by PCR NEGATIVE NEGATIVE Final   Influenza B by PCR NEGATIVE NEGATIVE Final    Comment: (NOTE) The Xpert Xpress SARS-CoV-2/FLU/RSV plus assay is intended as an aid in the diagnosis of influenza from Nasopharyngeal swab specimens and should not be used as a sole basis for treatment. Nasal washings and aspirates are unacceptable for Xpert Xpress SARS-CoV-2/FLU/RSV testing.  Fact Sheet for Patients: BloggerCourse.com  Fact Sheet for Healthcare Providers: SeriousBroker.it  This test is not yet approved or cleared by the [place]  FDA and  has been authorized for detection and/or diagnosis of SARS-CoV-2 by FDA under an Emergency Use Authorization (EUA). This EUA will remain in effect (meaning this test can be used) for the duration of the COVID-19 declaration under Section 564(b)(1) of the Act, 21 U.S.C. section 360bbb-3(b)(1), unless the authorization is terminated or revoked.     Resp Syncytial Virus by PCR NEGATIVE NEGATIVE Final    Comment: (NOTE) Fact Sheet for Patients: BloggerCourse.com  Fact Sheet for Healthcare Providers: SeriousBroker.it  This test is not yet approved  or cleared by the [place]  FDA and has been authorized for detection and/or diagnosis of SARS-CoV-2 by FDA under an Emergency Use Authorization (EUA). This EUA will remain in effect (meaning this test can be used) for the duration of the COVID-19 declaration under Section 564(b)(1) of the Act, 21 U.S.C. section 360bbb-3(b)(1), unless the authorization is terminated or revoked.  Performed at [facility], [address], [place], [place] [address]      Radiological Exams on Admission: CT ANGIO HEAD NECK W WO CM Result Date: 06/13/2024 CLINICAL DATA:  eval for large ves occlusion. Posterior. Dizziness. Slurred speech. Headaches. EXAM: CT ANGIOGRAPHY HEAD AND NECK WITH AND WITHOUT CONTRAST TECHNIQUE: Multidetector CT imaging of the head and neck was performed using the standard protocol during bolus administration of intravenous contrast. Multiplanar CT image reconstructions and MIPs were obtained to evaluate the vascular anatomy. Carotid stenosis measurements (when applicable) are obtained utilizing NASCET criteria, using the distal internal carotid diameter as the denominator. RADIATION DOSE REDUCTION: This exam was performed according to the departmental dose-optimization program which includes automated exposure control, adjustment of the mA and/or kV according to patient size and/or use of iterative reconstruction technique. CONTRAST:  75mL OMNIPAQUE  IOHEXOL  350 MG/ML SOLN COMPARISON:  Same day CT head. FINDINGS: CTA NECK FINDINGS Aortic arch: Great vessel origins are patent without significant stenosis. Right carotid system: No evidence of dissection, stenosis (50% or greater), or occlusion. Left carotid system: No evidence of dissection, stenosis (50% or greater), or occlusion. Vertebral arteries: Codominant. No evidence of dissection, stenosis (50% or greater), or occlusion. Skeleton: Negative. Other neck: No acute abnormality on limited assessment. Upper chest: Lung apices are clear. Review  of the MIP images confirms the above findings CTA HEAD FINDINGS Anterior circulation: Bilateral intracranial ICAs, MCAs and ACAs are patent without proximal hemodynamically significant stenosis. Posterior circulation: Bilateral intradural vertebral arteries, basilar artery and bilateral posterior cerebral arteries are patent without proximal without hemodynamically significant stenosis. Venous sinuses: As permitted by contrast timing, patent. Review of the MIP images confirms the above findings IMPRESSION: No emergent large vessel occlusion or proximal hemodynamically significant stenosis. Electronically Signed   By: Gilmore GORMAN Molt M.D.   On: 06/13/2024 23:53   CT HEAD CODE STROKE WO CONTRAST Result Date: 06/13/2024 CLINICAL DATA:  Code stroke.  Neuro deficit, acute, stroke suspected EXAM: CT HEAD WITHOUT CONTRAST TECHNIQUE: Contiguous axial images were obtained from the base of the skull through the vertex without intravenous contrast. RADIATION DOSE REDUCTION: This exam was performed according to the departmental dose-optimization program which includes automated exposure control, adjustment of the mA and/or kV according to patient size and/or use of iterative reconstruction technique. COMPARISON:  CT head 07/13/2021. FINDINGS: Brain: No evidence of acute infarction, hemorrhage, hydrocephalus, extra-axial collection or mass lesion/mass effect. Vascular: No hyperdense vessel. Skull: No acute fracture. Sinuses/Orbits: No acute finding. Other: No mastoid effusions. ASPECTS [facility] Stroke Program Early CT Score) Total score (0-10 with 10 being normal): 10. IMPRESSION: No evidence of  acute intracranial abnormality. Aspects is 10. Code stroke imaging results were communicated on 06/13/2024 at 10:40 pm to provider Dr. Lavonia Via telephone. Electronically Signed   By: Gilmore GORMAN Molt M.D.   On: 06/13/2024 22:40    EKG: Independently reviewed. Sinus rhythm.   Assessment/Plan   1. Vertigo  - No acute findings  on head CT or CTA head and neck  - Check MRI brain, continue supportive care, infectious and toxic/metabolic workup with UDS, UA, CXR, viral panel, ammonia, B12, RPR, and VBG    DVT prophylaxis: Lovenox   Code Status: Full  Level of Care: Level of care: Telemetry Family Communication: Wife at bedside  Disposition Plan:  Patient is from: Home  Anticipated d/c is to: Home  Anticipated d/c date is: 06/15/24  Patient currently: Pending additional workup Consults called: Teleneurology  Admission status: Observation     Evalene GORMAN Sprinkles, MD [facility]  06/14/2024, 12:56 AM

## 2024-06-16 LAB — LYME DISEASE SEROLOGY W/REFLEX: Lyme Total Antibody EIA: NEGATIVE

## 2024-09-27 ENCOUNTER — Ambulatory Visit: Attending: Cardiology | Admitting: Cardiology

## 2024-09-27 NOTE — Progress Notes (Deleted)
      Clinical Summary Mr. Vaneaton is a 38 y.o.male      2.Vertigo/Near syncope - recent admission 05/2024 - presented with room spinning sensation, headache, chills, generalized weakness - extensive cardiac workup was benign - reportedly had some HRs low 50s during admission  No past medical history on file.   Allergies  Allergen Reactions   Celebrex [Celecoxib] Nausea And Vomiting     Current Outpatient Medications  Medication Sig Dispense Refill   Cholecalciferol (VITAMIN D3) 1.25 MG (50000 UT) CAPS Take 1 capsule by mouth.     No current facility-administered medications for this visit.     Past Surgical History:  Procedure Laterality Date   KNEE SURGERY     2 on each knee     Allergies  Allergen Reactions   Celebrex [Celecoxib] Nausea And Vomiting      No family history on file.   Social History Mr. Beeck reports that he has never smoked. He has never used smokeless tobacco. Mr. Kandler reports no history of alcohol use.   Review of Systems CONSTITUTIONAL: No weight loss, fever, chills, weakness or fatigue.  HEENT: Eyes: No visual loss, blurred vision, double vision or yellow sclerae.No hearing loss, sneezing, congestion, runny nose or sore throat.  SKIN: No rash or itching.  CARDIOVASCULAR:  RESPIRATORY: No shortness of breath, cough or sputum.  GASTROINTESTINAL: No anorexia, nausea, vomiting or diarrhea. No abdominal pain or blood.  GENITOURINARY: No burning on urination, no polyuria NEUROLOGICAL: No headache, dizziness, syncope, paralysis, ataxia, numbness or tingling in the extremities. No change in bowel or bladder control.  MUSCULOSKELETAL: No muscle, back pain, joint pain or stiffness.  LYMPHATICS: No enlarged nodes. No history of splenectomy.  PSYCHIATRIC: No history of depression or anxiety.  ENDOCRINOLOGIC: No reports of sweating, cold or heat intolerance. No polyuria or polydipsia.  SABRA   Physical Examination There were no vitals  filed for this visit. There were no vitals filed for this visit.  Gen: resting comfortably, no acute distress HEENT: no scleral icterus, pupils equal round and reactive, no palptable cervical adenopathy,  CV Resp: Clear to auscultation bilaterally GI: abdomen is soft, non-tender, non-distended, normal bowel sounds, no hepatosplenomegaly MSK: extremities are warm, no edema.  Skin: warm, no rash Neuro:  no focal deficits Psych: appropriate affect   Diagnostic Studies     Assessment and Plan        Dorn PHEBE Ross, M.D., F.A.C.C.

## 2024-10-25 ENCOUNTER — Encounter: Payer: Self-pay | Admitting: General Practice
